# Patient Record
Sex: Female | Born: 1990 | Race: White | Hispanic: No | Marital: Single | Smoking: Never smoker
Health system: Southern US, Community
[De-identification: ages and names within clinical notes are randomized; demographics above are authoritative.]

## PROBLEM LIST (undated history)

## (undated) DIAGNOSIS — T7840XA Allergy, unspecified, initial encounter: Secondary | ICD-10-CM

## (undated) DIAGNOSIS — F419 Anxiety disorder, unspecified: Secondary | ICD-10-CM

## (undated) DIAGNOSIS — E785 Hyperlipidemia, unspecified: Secondary | ICD-10-CM

## (undated) DIAGNOSIS — E669 Obesity, unspecified: Secondary | ICD-10-CM

## (undated) DIAGNOSIS — R000 Tachycardia, unspecified: Secondary | ICD-10-CM

## (undated) HISTORY — DX: Hyperlipidemia, unspecified: E78.5

## (undated) HISTORY — PX: INCISION AND DRAINAGE ABSCESS: SHX5864

## (undated) HISTORY — DX: Allergy, unspecified, initial encounter: T78.40XA

## (undated) HISTORY — DX: Anxiety disorder, unspecified: F41.9

## (undated) HISTORY — DX: Tachycardia, unspecified: R00.0

## (undated) HISTORY — PX: TONSILLECTOMY: SUR1361

## (undated) HISTORY — DX: Obesity, unspecified: E66.9

---
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
## 2011-11-10 IMAGING — US ABDOMEN ULTRASOUND
1 series · 17 of 25 positions shown · non-contrast
Comparison: none

REASON FOR EXAM: abd pelvic pain     wwo transvaginal
COMMENTS:

[Series 1: abdomen ultrasound · 17 of 72 slices shown]
[im 1/72]
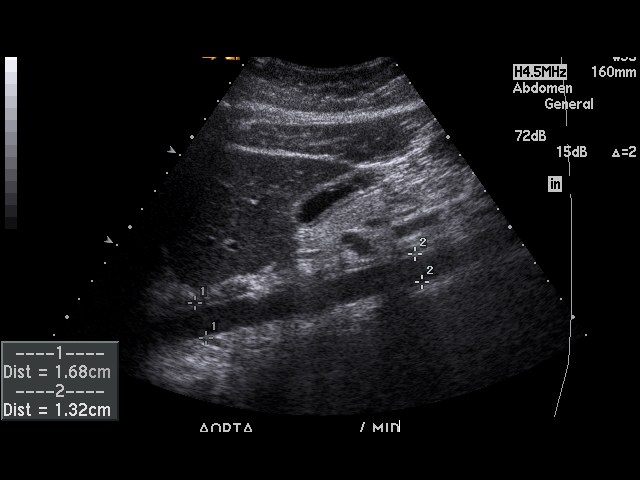
[im 6/72]
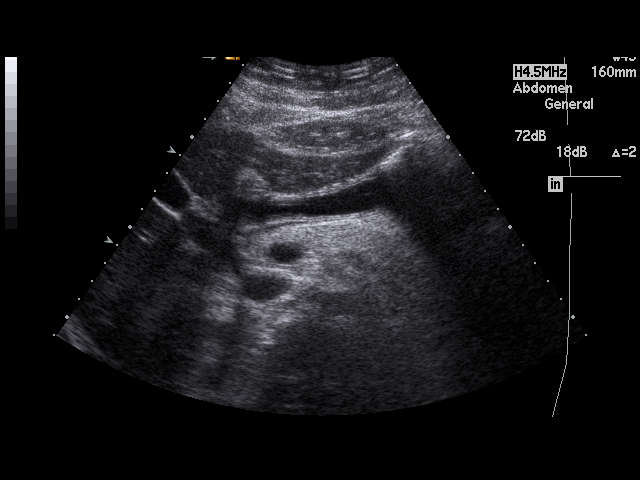
[im 9/72]
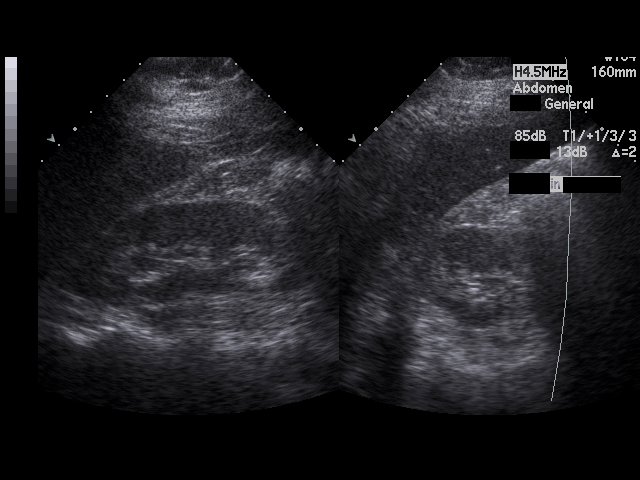
[im 15/72]
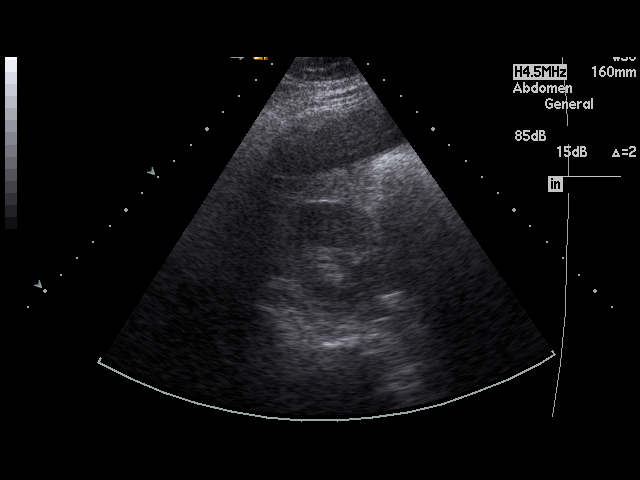
[im 18/72]
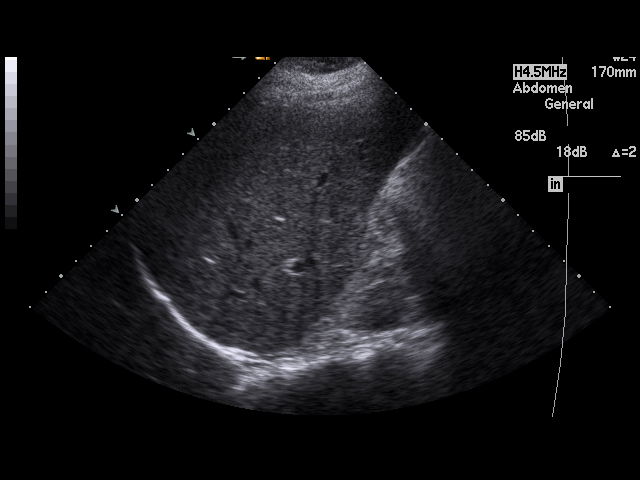
[im 24/72]
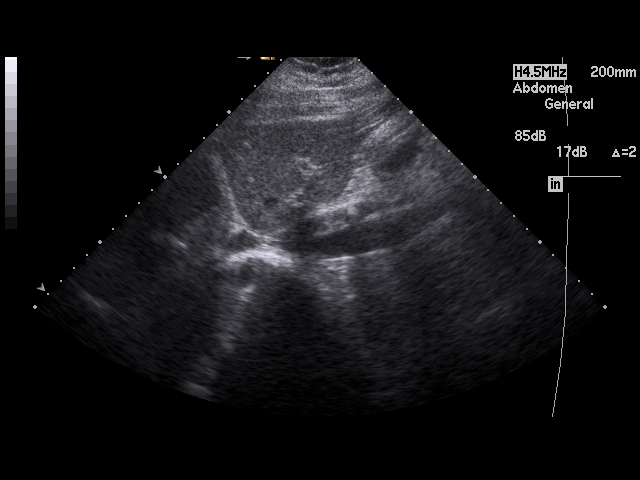
[im 27/72]
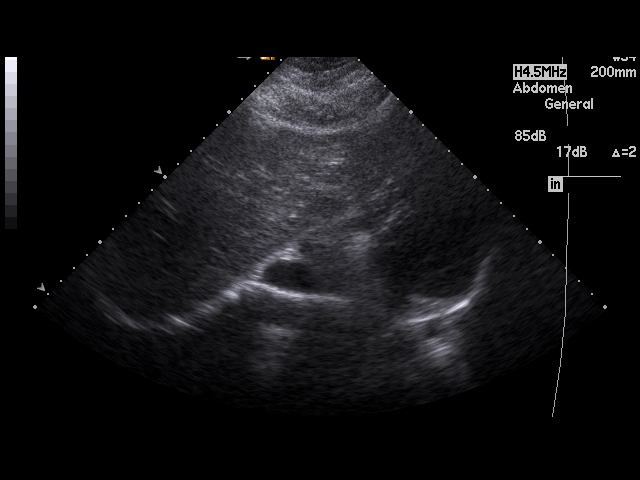
[im 33/72]
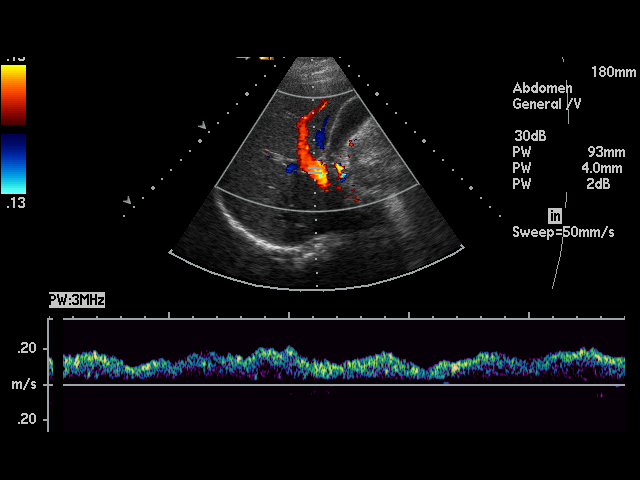
[im 36/72]
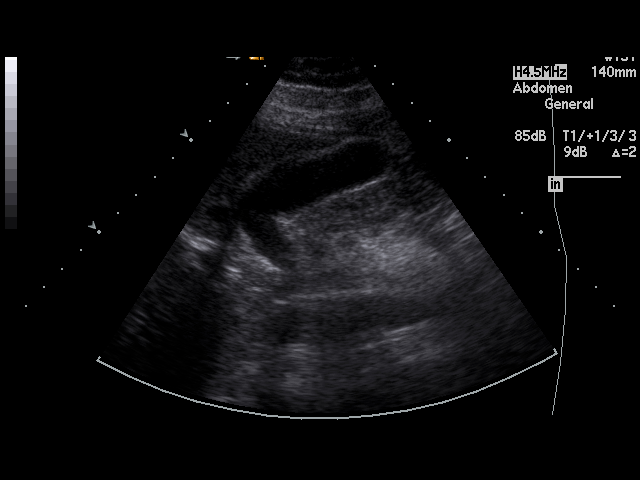
[im 39/72]
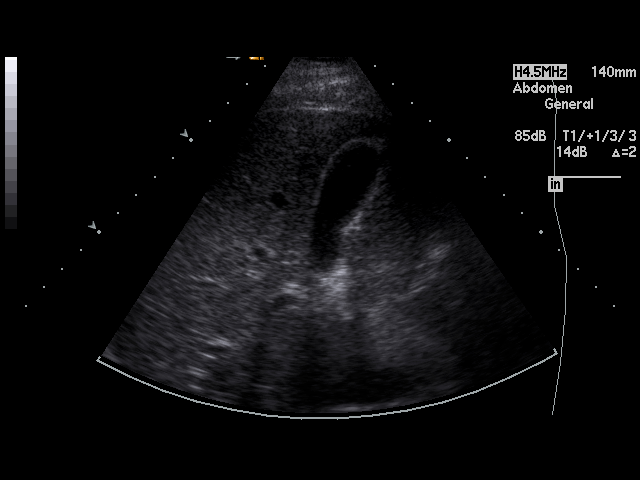
[im 45/72]
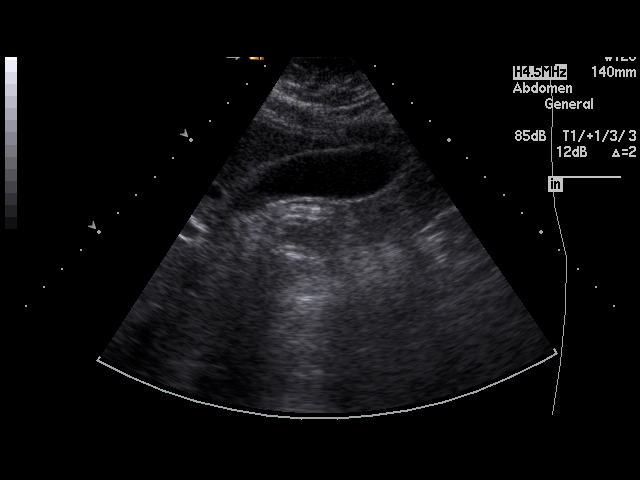
[im 48/72]
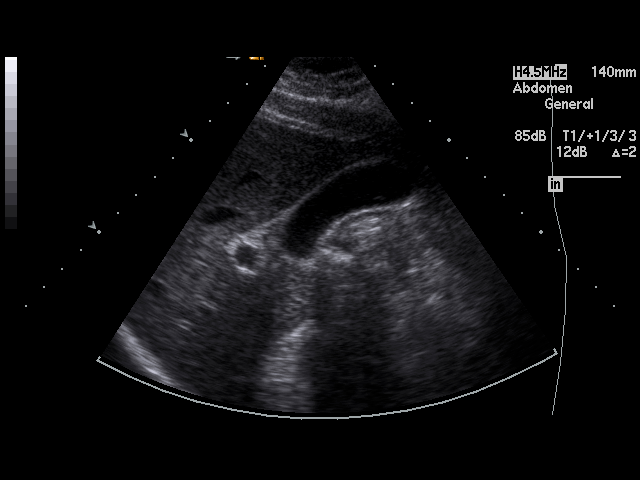
[im 54/72]
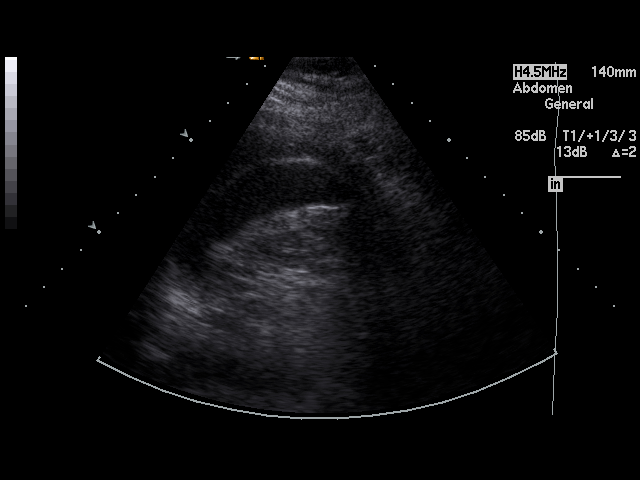
[im 57/72]
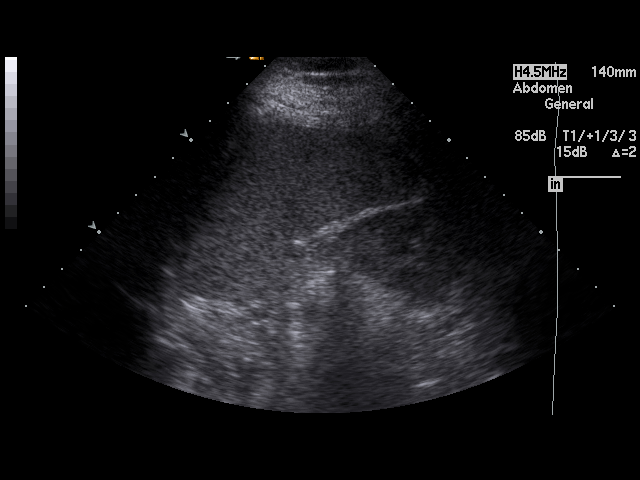
[im 63/72]
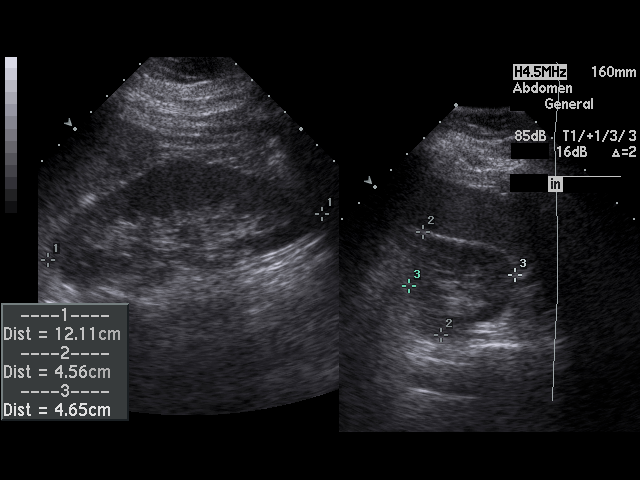
[im 66/72]
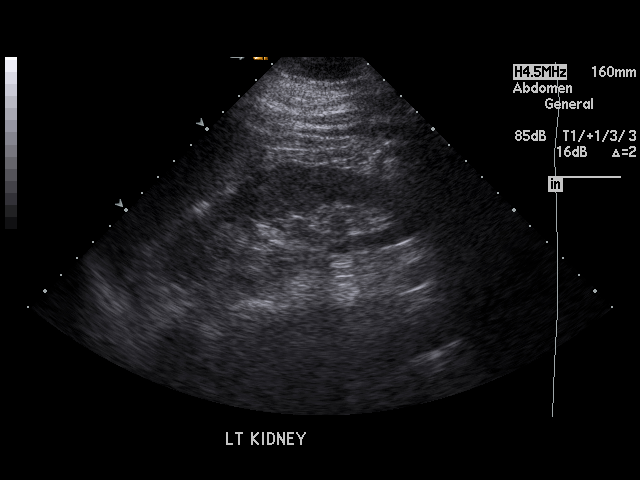
[im 72/72]
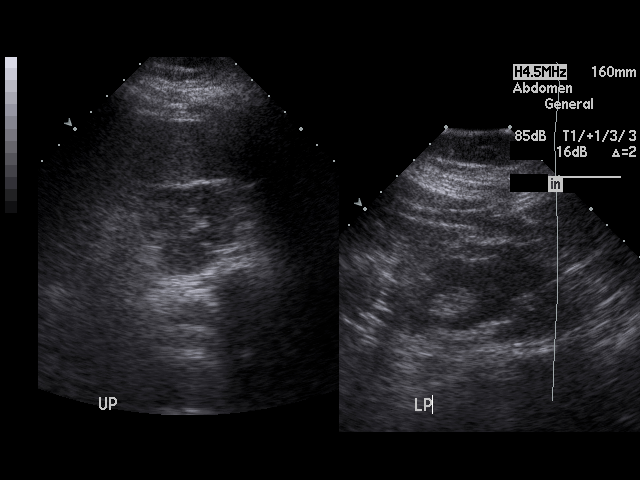

[17 of 25 positions shown; findings below may reference images not displayed]

PROCEDURE:     NASOMA - NASOMA ABDOMEN GENERAL SURVEY  - March 05, 2010 [DATE]

RESULT:     The liver, abdominal aorta and inferior vena cava show no
significant abnormalities. Spleen size is normal. There is a linear
hyperechoic band in the spleen which could be developmental or could be
secondary to residual change from prior spleen trauma. At any rate, this is
not an acute finding. The pancreas is not visualized adequately for
evaluation on this exam. No gallstones are seen. There is no thickening of
the gallbladder wall. The common bile duct measures 2.9 mm in diameter which
is within normal limits. The kidneys show no hydronephrosis. There is no
ascites.
IMPRESSION: 1. No gallstones or other acute change is identified.
2. The pancreas is not visualized adequately for evaluation on this exam.
3. There is a linear density traversing the spleen compatible with a
fibrotic band that could either be developmental or secondary to prior
trauma.

## 2013-08-20 ENCOUNTER — Ambulatory Visit: Payer: Self-pay | Admitting: Physician Assistant

## 2013-08-20 LAB — RAPID STREP-A WITH REFLX: MICRO TEXT REPORT: NEGATIVE

## 2013-08-20 LAB — RAPID INFLUENZA A&B ANTIGENS

## 2013-08-23 LAB — BETA STREP CULTURE(ARMC)

## 2013-11-04 ENCOUNTER — Ambulatory Visit: Payer: Self-pay | Admitting: Physician Assistant

## 2013-11-07 DIAGNOSIS — B029 Zoster without complications: Secondary | ICD-10-CM | POA: Insufficient documentation

## 2013-12-23 ENCOUNTER — Ambulatory Visit: Payer: Self-pay | Admitting: Physician Assistant

## 2013-12-23 LAB — CBC WITH DIFFERENTIAL/PLATELET
Basophil #: 0 10*3/uL (ref 0.0–0.1)
Basophil %: 0.4 %
EOS PCT: 2.8 %
Eosinophil #: 0.2 10*3/uL (ref 0.0–0.7)
HCT: 40.6 % (ref 35.0–47.0)
HGB: 13.3 g/dL (ref 12.0–16.0)
Lymphocyte #: 1.9 10*3/uL (ref 1.0–3.6)
Lymphocyte %: 21.7 %
MCH: 27.8 pg (ref 26.0–34.0)
MCHC: 32.9 g/dL (ref 32.0–36.0)
MCV: 85 fL (ref 80–100)
Monocyte #: 0.7 x10 3/mm (ref 0.2–0.9)
Monocyte %: 7.5 %
Neutrophil #: 6 10*3/uL (ref 1.4–6.5)
Neutrophil %: 67.6 %
Platelet: 236 10*3/uL (ref 150–440)
RBC: 4.8 10*6/uL (ref 3.80–5.20)
RDW: 13.1 % (ref 11.5–14.5)
WBC: 8.9 10*3/uL (ref 3.6–11.0)

## 2013-12-26 ENCOUNTER — Ambulatory Visit: Payer: Self-pay | Admitting: Physician Assistant

## 2013-12-29 ENCOUNTER — Emergency Department: Payer: Self-pay | Admitting: Emergency Medicine

## 2013-12-29 LAB — CBC WITH DIFFERENTIAL/PLATELET
BASOS ABS: 0 10*3/uL (ref 0.0–0.1)
BASOS PCT: 0.2 %
EOS ABS: 0 10*3/uL (ref 0.0–0.7)
Eosinophil %: 0.3 %
HCT: 38.1 % (ref 35.0–47.0)
HGB: 13 g/dL (ref 12.0–16.0)
Lymphocyte #: 1.6 10*3/uL (ref 1.0–3.6)
Lymphocyte %: 18.7 %
MCH: 28.3 pg (ref 26.0–34.0)
MCHC: 34.2 g/dL (ref 32.0–36.0)
MCV: 83 fL (ref 80–100)
MONO ABS: 0.8 x10 3/mm (ref 0.2–0.9)
Monocyte %: 9.1 %
Neutrophil #: 6.1 10*3/uL (ref 1.4–6.5)
Neutrophil %: 71.7 %
Platelet: 248 10*3/uL (ref 150–440)
RBC: 4.61 10*6/uL (ref 3.80–5.20)
RDW: 13.2 % (ref 11.5–14.5)
WBC: 8.6 10*3/uL (ref 3.6–11.0)

## 2013-12-29 LAB — COMPREHENSIVE METABOLIC PANEL
ALT: 25 U/L (ref 12–78)
AST: 17 U/L (ref 15–37)
Albumin: 3.7 g/dL (ref 3.4–5.0)
Alkaline Phosphatase: 91 U/L
Anion Gap: 6 — ABNORMAL LOW (ref 7–16)
BILIRUBIN TOTAL: 0.3 mg/dL (ref 0.2–1.0)
BUN: 10 mg/dL (ref 7–18)
CHLORIDE: 106 mmol/L (ref 98–107)
CO2: 26 mmol/L (ref 21–32)
CREATININE: 0.89 mg/dL (ref 0.60–1.30)
Calcium, Total: 9.5 mg/dL (ref 8.5–10.1)
EGFR (African American): >60
EGFR (Non-African Amer.): >60
Glucose: 84 mg/dL (ref 65–99)
Osmolality: 274 (ref 275–301)
POTASSIUM: 3.7 mmol/L (ref 3.5–5.1)
SODIUM: 138 mmol/L (ref 136–145)
TOTAL PROTEIN: 8.1 g/dL (ref 6.4–8.2)

## 2013-12-29 LAB — URINALYSIS, COMPLETE
BACTERIA: NONE SEEN
Bilirubin,UR: NEGATIVE
Glucose,UR: NEGATIVE mg/dL (ref 0–75)
Hyaline Cast: 2
KETONE: NEGATIVE
LEUKOCYTE ESTERASE: NEGATIVE
Nitrite: NEGATIVE
Ph: 5 (ref 4.5–8.0)
Protein: 100
RBC,UR: 341 /HPF (ref 0–5)
Specific Gravity: 1.029 (ref 1.003–1.030)
Squamous Epithelial: 5

## 2013-12-29 LAB — LIPASE, BLOOD: LIPASE: 75 U/L (ref 73–393)

## 2013-12-30 ENCOUNTER — Encounter (HOSPITAL_COMMUNITY): Payer: Self-pay | Admitting: Emergency Medicine

## 2013-12-30 ENCOUNTER — Emergency Department (HOSPITAL_COMMUNITY)
Admission: EM | Admit: 2013-12-30 | Discharge: 2013-12-30 | Disposition: A | Discharge status: Home / Self Care | Payer: Private Health Insurance - Indemnity | Attending: Emergency Medicine | Admitting: Emergency Medicine

## 2013-12-30 DIAGNOSIS — R000 Tachycardia, unspecified: Secondary | ICD-10-CM | POA: Insufficient documentation

## 2013-12-30 DIAGNOSIS — L03319 Cellulitis of trunk, unspecified: Principal | ICD-10-CM

## 2013-12-30 DIAGNOSIS — Z792 Long term (current) use of antibiotics: Secondary | ICD-10-CM | POA: Insufficient documentation

## 2013-12-30 DIAGNOSIS — Z79899 Other long term (current) drug therapy: Secondary | ICD-10-CM | POA: Insufficient documentation

## 2013-12-30 DIAGNOSIS — Z3202 Encounter for pregnancy test, result negative: Secondary | ICD-10-CM | POA: Insufficient documentation

## 2013-12-30 DIAGNOSIS — IMO0002 Reserved for concepts with insufficient information to code with codable children: Secondary | ICD-10-CM

## 2013-12-30 DIAGNOSIS — L02219 Cutaneous abscess of trunk, unspecified: Secondary | ICD-10-CM | POA: Insufficient documentation

## 2013-12-30 LAB — COMPREHENSIVE METABOLIC PANEL
ALT: 16 U/L (ref 0–35)
AST: 15 U/L (ref 0–37)
Albumin: 3.5 g/dL (ref 3.5–5.2)
Alkaline Phosphatase: 73 U/L (ref 39–117)
BUN: 7 mg/dL (ref 6–23)
CALCIUM: 9.4 mg/dL (ref 8.4–10.5)
CO2: 24 mEq/L (ref 19–32)
CREATININE: 0.75 mg/dL (ref 0.50–1.10)
Chloride: 104 mEq/L (ref 96–112)
GLUCOSE: 83 mg/dL (ref 70–99)
Potassium: 4 mEq/L (ref 3.7–5.3)
Sodium: 140 mEq/L (ref 137–147)
Total Bilirubin: 0.2 mg/dL — ABNORMAL LOW (ref 0.3–1.2)
Total Protein: 6.7 g/dL (ref 6.0–8.3)

## 2013-12-30 LAB — URINALYSIS, ROUTINE W REFLEX MICROSCOPIC
BILIRUBIN URINE: NEGATIVE
Glucose, UA: NEGATIVE mg/dL
Hgb urine dipstick: NEGATIVE
Ketones, ur: NEGATIVE mg/dL
LEUKOCYTES UA: NEGATIVE
NITRITE: NEGATIVE
PH: 6 (ref 5.0–8.0)
Protein, ur: NEGATIVE mg/dL
SPECIFIC GRAVITY, URINE: 1.008 (ref 1.005–1.030)
UROBILINOGEN UA: 0.2 mg/dL (ref 0.0–1.0)

## 2013-12-30 LAB — CBC WITH DIFFERENTIAL/PLATELET
BASOS ABS: 0 10*3/uL (ref 0.0–0.1)
Basophils Relative: 0 % (ref 0–1)
EOS PCT: 0 % (ref 0–5)
Eosinophils Absolute: 0 10*3/uL (ref 0.0–0.7)
HCT: 36.9 % (ref 36.0–46.0)
Hemoglobin: 12 g/dL (ref 12.0–15.0)
Lymphocytes Relative: 22 % (ref 12–46)
Lymphs Abs: 1.7 10*3/uL (ref 0.7–4.0)
MCH: 27 pg (ref 26.0–34.0)
MCHC: 32.5 g/dL (ref 30.0–36.0)
MCV: 82.9 fL (ref 78.0–100.0)
MONO ABS: 0.8 10*3/uL (ref 0.1–1.0)
Monocytes Relative: 10 % (ref 3–12)
Neutro Abs: 5.1 10*3/uL (ref 1.7–7.7)
Neutrophils Relative %: 68 % (ref 43–77)
PLATELETS: 243 10*3/uL (ref 150–400)
RBC: 4.45 MIL/uL (ref 3.87–5.11)
RDW: 12.5 % (ref 11.5–15.5)
WBC: 7.6 10*3/uL (ref 4.0–10.5)

## 2013-12-30 LAB — PREGNANCY, URINE: PREG TEST UR: NEGATIVE

## 2013-12-30 MED ORDER — OXYCODONE-ACETAMINOPHEN 5-325 MG PO TABS
1.0000 | ORAL_TABLET | Freq: Once | ORAL | Status: AC
  Administered 2013-12-30: 1 via ORAL
  Filled 2013-12-30: qty 1

## 2013-12-30 MED ORDER — HYDROCODONE-ACETAMINOPHEN 5-325 MG PO TABS
1.0000 | ORAL_TABLET | ORAL | Status: DC | PRN
Start: 2013-12-30 — End: 2015-03-06

## 2013-12-30 NOTE — ED Notes (Signed)
MD at bedside. Rubye OaksPalmer PA

## 2013-12-30 NOTE — ED Provider Notes (Signed)
CSN: [id number]     Arrival date & time 12/30/13  6295 History   First MD Initiated Contact with Patient 12/30/13 [phone]     Chief Complaint  Patient presents with  . Abdominal Pain   HPI  Veronica Hurst is a 23 y.o. female with a PMH of abscesses who presents to the ED for evaluation of abdominal pain. History was provided by the patient. Patient states she has had an abscess to her periumbilical region for the past 1.5 weeks, which has been getting progressively larger and more painful. She has a blister on top of the area but denies any drainage. No previous wounds or injuries. Has had abscesses in the past in her axilla but never in her abdomen. Patient has been taking Ibuprofen and Tylenol PM for pain. She denies any diarrhea, constipation, dysuria, vaginal bleeding/discharge, fever, chills, change in appetite/activity. She went to UC twice for this and was started on Bactrim one week ago which she has been taking. She takes doxycycline chronically and has been on this since January.      History reviewed. No pertinent past medical history. Past Surgical History  Procedure Laterality Date  . Tonsillectomy     No family history on file. History  Substance Use Topics  . Smoking status: Never Smoker   . Smokeless tobacco: Not on file  . Alcohol Use: Yes     Comment: occassional   OB History   Grav Para Term Preterm Abortions TAB SAB Ect Mult Living                  Review of Systems  Constitutional: Negative for fever, chills, activity change, appetite change and fatigue.  Respiratory: Negative for cough and shortness of breath.   Cardiovascular: Negative for chest pain and leg swelling.  Gastrointestinal: Positive for abdominal pain. Negative for nausea, vomiting, diarrhea, constipation and blood in stool.  Genitourinary: Negative for dysuria, decreased urine volume, vaginal bleeding, vaginal discharge, difficulty urinating, vaginal pain and pelvic pain.  Musculoskeletal: Negative  for myalgias and neck pain.  Skin: Positive for color change and wound.  Neurological: Negative for dizziness, weakness, light-headedness and headaches.    Allergies  Review of patient's allergies indicates no known allergies.  Home Medications   Prior to Admission medications   Medication Sig Start Date End Date Taking? Authorizing Provider  amphetamine-dextroamphetamine (ADDERALL) 20 MG tablet Take 40 mg by mouth daily.   Yes Historical Provider, MD  Biotin 10 MG CAPS Take 1 capsule by mouth daily.   Yes Historical Provider, MD  doxycycline (VIBRA-TABS) 100 MG tablet Take 100 mg by mouth 2 (two) times daily.   Yes Historical Provider, MD  magnesium gluconate (MAGONATE) 500 MG tablet Take 500 mg by mouth daily.   Yes Historical Provider, MD  sulfamethoxazole-trimethoprim (BACTRIM DS) 800-160 MG per tablet Take 1 tablet by mouth 2 (two) times daily.   Yes Historical Provider, MD   BP 135/91  Pulse 106  Temp(Src) 98 F (36.7 C) (Oral)  Resp 16  Ht 5\' 4"  (1.626 m)  Wt 204 lb (92.534 kg)  BMI 35.00 kg/m2  SpO2 100%  LMP 12/19/2013  Filed Vitals:   12/30/13 1100 12/30/13 1115 12/30/13 1129 12/30/13 1130  BP: 99/64 103/66 106/68 106/68  Pulse: 102 102 97 96  Temp:      TempSrc:      Resp:      Height:      Weight:      SpO2: 100% 100% 100%  100%    Physical Exam  Nursing note and vitals reviewed. Constitutional: She is oriented to person, place, and time. She appears well-developed and well-nourished. No distress.  HENT:  Head: Normocephalic and atraumatic.  Right Ear: External ear normal.  Left Ear: External ear normal.  Nose: Nose normal.  Mouth/Throat: Oropharynx is clear and moist.  Eyes: Conjunctivae are normal. Right eye exhibits no discharge. Left eye exhibits no discharge.  Neck: Normal range of motion. Neck supple.  Cardiovascular: Normal rate, regular rhythm and normal heart sounds.  Exam reveals no gallop and no friction rub.   No murmur  heard. Pulmonary/Chest: Effort normal and breath sounds normal. No respiratory distress. She has no wheezes. She has no rales. She exhibits no tenderness.  Abdominal: Soft. Bowel sounds are normal. She exhibits no distension and no mass. There is tenderness. There is no rebound and no guarding.    Periumbilical superficial 3 cm x 3 cm area of induration with an overlying area of fluctuance. No open wounds or drainage. Focal tenderness to the area. No tenderness to palpation to the rest of the abdomen throughout.  Musculoskeletal: Normal range of motion. She exhibits no edema and no tenderness.  Neurological: She is alert and oriented to person, place, and time.  Skin: Skin is warm and dry. She is not diaphoretic.     ED Course  INCISION AND DRAINAGE Date/Time: 12/30/2013 11:51 AM Performed by: Coral CeoPALMER, Mry Lamia K Authorized by: Jillyn LedgerPALMER, Rayner Erman K Consent: Verbal consent obtained. Consent given by: patient Patient identity confirmed: verbally with patient Type: abscess Body area: trunk Location details: abdomen Anesthesia: local infiltration Local anesthetic: lidocaine 2% without epinephrine Anesthetic total: 6 ml Patient sedated: no Scalpel size: 11 Incision type: single straight Complexity: simple Drainage: purulent and  bloody Drainage amount: scant Wound treatment: wound left open Packing material: none Patient tolerance: Patient tolerated the procedure well with no immediate complications. Comments: Patient unable to tolerate drain    (including critical care time) Labs Review Labs Reviewed - No data to display  Imaging Review No results found.   EKG Interpretation None      Results for orders placed during the hospital encounter of 12/30/13  CBC WITH DIFFERENTIAL      Result Value Ref Range   WBC 7.6  4.0 - 10.5 K/uL   RBC 4.45  3.87 - 5.11 MIL/uL   Hemoglobin 12.0  12.0 - 15.0 g/dL   HCT 16.136.9  09.636.0 - 04.546.0 %   MCV 82.9  78.0 - 100.0 fL   MCH 27.0  26.0 - 34.0  pg   MCHC 32.5  30.0 - 36.0 g/dL   RDW 40.912.5  81.111.5 - 91.415.5 %   Platelets 243  150 - 400 K/uL   Neutrophils Relative % 68  43 - 77 %   Neutro Abs 5.1  1.7 - 7.7 K/uL   Lymphocytes Relative 22  12 - 46 %   Lymphs Abs 1.7  0.7 - 4.0 K/uL   Monocytes Relative 10  3 - 12 %   Monocytes Absolute 0.8  0.1 - 1.0 K/uL   Eosinophils Relative 0  0 - 5 %   Eosinophils Absolute 0.0  0.0 - 0.7 K/uL   Basophils Relative 0  0 - 1 %   Basophils Absolute 0.0  0.0 - 0.1 K/uL  COMPREHENSIVE METABOLIC PANEL      Result Value Ref Range   Sodium 140  137 - 147 mEq/L   Potassium 4.0  3.7 - 5.3  mEq/L   Chloride 104  96 - 112 mEq/L   CO2 24  19 - 32 mEq/L   Glucose, Bld 83  70 - 99 mg/dL   BUN 7  6 - 23 mg/dL   Creatinine, Ser 0.980.75  0.50 - 1.10 mg/dL   Calcium 9.4  8.4 - 11.910.5 mg/dL   Total Protein 6.7  6.0 - 8.3 g/dL   Albumin 3.5  3.5 - 5.2 g/dL   AST 15  0 - 37 U/L   ALT 16  0 - 35 U/L   Alkaline Phosphatase 73  39 - 117 U/L   Total Bilirubin 0.2 (*) 0.3 - 1.2 mg/dL   GFR calc non Af Amer >90  >90 mL/min   GFR calc Af Amer >90  >90 mL/min  URINALYSIS, ROUTINE W REFLEX MICROSCOPIC      Result Value Ref Range   Color, Urine YELLOW  YELLOW   APPearance CLEAR  CLEAR   Specific Gravity, Urine 1.008  1.005 - 1.030   pH 6.0  5.0 - 8.0   Glucose, UA NEGATIVE  NEGATIVE mg/dL   Hgb urine dipstick NEGATIVE  NEGATIVE   Bilirubin Urine NEGATIVE  NEGATIVE   Ketones, ur NEGATIVE  NEGATIVE mg/dL   Protein, ur NEGATIVE  NEGATIVE mg/dL   Urobilinogen, UA 0.2  0.0 - 1.0 mg/dL   Nitrite NEGATIVE  NEGATIVE   Leukocytes, UA NEGATIVE  NEGATIVE  PREGNANCY, URINE      Result Value Ref Range   Preg Test, Ur NEGATIVE  NEGATIVE     MDM   Veronica Hurst is a 23 y.o. female with a PMH of abscesses who presents to the ED for evaluation of abdominal pain, which is likely due to an abdominal abscess. Abscess drained in the ED. Doubt intraabdominal process. Abdominal tenderness is localized to the area. Patient afebrile and  non-toxic in appearance. No leukocytosis. Labs unremarkable. Patient currently on doxycycline and bactrim. Instructed patient to continue antibiotics and follow-up with PCP on Monday for wound re-check. Patient had mild tachycardia which I suspect is due to pain and anxiety. No chest pain or SOB. Vitals otherwise stable. Return precautions, discharge instructions, and follow-up was discussed with the patient before discharge.      Discharge Medication List as of 12/30/2013 11:56 AM    START taking these medications   Details  HYDROcodone-acetaminophen (NORCO/VICODIN) 5-325 MG per tablet Take 1-2 tablets by mouth every 4 (four) hours as needed., Starting 12/30/2013, Until Discontinued, Print         Final impressions: 1. Abscess, abdomen      Luiz IronJessica Katlin Jamoni Broadfoot PA-C   This patient was discussed with Dr. Barbarann EhlersJacubowitz        Exodus Kutzer K Tylor Courtwright, PA-C 12/30/13 2222

## 2013-12-30 NOTE — Discharge Instructions (Signed)
Warm compresses several times a day  Keep area clean and dry  Antibiotic ointment  Vicodin for severe pain - Please be careful with this medication.  It can cause drowsiness.  Use caution while driving, operating machinery, drinking alcohol, or any other activities that may impair your physical or mental abilities.   Ibuprofen for mild-moderate pain  Continue to take Doxycycline and bactrim  Return to the emergency department if you develop any changing/worsening condition, repeated vomiting, severe abdominal pain, fever, increased drainage, spreading redness/swelling, or any other concerns (please read additional information regarding your condition below)   Abscess An abscess is an infected area that contains a collection of pus and debris.It can occur in almost any part of the body. An abscess is also known as a furuncle or boil. CAUSES  An abscess occurs when tissue gets infected. This can occur from blockage of oil or sweat glands, infection of hair follicles, or a minor injury to the skin. As the body tries to fight the infection, pus collects in the area and creates pressure under the skin. This pressure causes pain. People with weakened immune systems have difficulty fighting infections and get certain abscesses more often.  SYMPTOMS Usually an abscess develops on the skin and becomes a painful mass that is red, warm, and tender. If the abscess forms under the skin, you may feel a moveable soft area under the skin. Some abscesses break open (rupture) on their own, but most will continue to get worse without care. The infection can spread deeper into the body and eventually into the bloodstream, causing you to feel ill.  DIAGNOSIS  Your caregiver will take your medical history and perform a physical exam. A sample of fluid may also be taken from the abscess to determine what is causing your infection. TREATMENT  Your caregiver may prescribe antibiotic medicines to fight the infection.  However, taking antibiotics alone usually does not cure an abscess. Your caregiver may need to make a small cut (incision) in the abscess to drain the pus. In some cases, gauze is packed into the abscess to reduce pain and to continue draining the area. HOME CARE INSTRUCTIONS   Only take over-the-counter or prescription medicines for pain, discomfort, or fever as directed by your caregiver.  If you were prescribed antibiotics, take them as directed. Finish them even if you start to feel better.  If gauze is used, follow your caregiver's directions for changing the gauze.  To avoid spreading the infection:  Keep your draining abscess covered with a bandage.  Wash your hands well.  Do not share personal care items, towels, or whirlpools with others.  Avoid skin contact with others.  Keep your skin and clothes clean around the abscess.  Keep all follow-up appointments as directed by your caregiver. SEEK MEDICAL CARE IF:   You have increased pain, swelling, redness, fluid drainage, or bleeding.  You have muscle aches, chills, or a general ill feeling.  You have a fever. MAKE SURE YOU:   Understand these instructions.  Will watch your condition.  Will get help right away if you are not doing well or get worse. Document Released: 05/14/2005 Document Revised: 02/03/2012 Document Reviewed: 10/17/2011 [facility] Patient Information 2014 [employer] [place].

## 2013-12-30 NOTE — ED Notes (Signed)
Pt. Developed an area that is red, blistered and noticeable pus upper inner belly button.  Pt. Also has pain on palpation above the umbilicus.  Pt. Very guarded and site is very tender.   Denies any n/v/d denies any fevers.

## 2013-12-30 NOTE — ED Provider Notes (Signed)
Patient with 3 cm superficial abscess at umbilicus. Draining yellow pus. She has mild tenderness and redness immediately overlying the area. No surrounding tenderness. Is nontoxic and well-appearing. Appetite normal. She did not feel ill   Doug SouSam Shanavia Makela, MD 12/30/13 1103

## 2014-01-02 NOTE — ED Provider Notes (Signed)
Medical screening examination/treatment/procedure(s) were conducted as a shared visit with non-physician practitioner(s) and myself.  I personally evaluated the patient during the encounter.   EKG Interpretation None       Doug SouSam Channelle Bottger, MD 01/02/14 [phone]

## 2014-11-29 ENCOUNTER — Encounter: Payer: Self-pay | Admitting: *Deleted

## 2015-03-06 ENCOUNTER — Encounter: Payer: Self-pay | Admitting: Obstetrics and Gynecology

## 2015-03-06 ENCOUNTER — Encounter: Payer: Self-pay | Admitting: Family Medicine

## 2015-03-06 ENCOUNTER — Ambulatory Visit (INDEPENDENT_AMBULATORY_CARE_PROVIDER_SITE_OTHER): Payer: 59 | Admitting: Family Medicine

## 2015-03-06 VITALS — BP 118/72 | HR 100 | Temp 97.9°F | Resp 16 | Ht 63.0 in | Wt 211.6 lb

## 2015-03-06 DIAGNOSIS — R5383 Other fatigue: Secondary | ICD-10-CM | POA: Diagnosis not present

## 2015-03-06 DIAGNOSIS — F419 Anxiety disorder, unspecified: Secondary | ICD-10-CM | POA: Insufficient documentation

## 2015-03-06 DIAGNOSIS — F900 Attention-deficit hyperactivity disorder, predominantly inattentive type: Secondary | ICD-10-CM

## 2015-03-06 DIAGNOSIS — Z131 Encounter for screening for diabetes mellitus: Secondary | ICD-10-CM | POA: Diagnosis not present

## 2015-03-06 DIAGNOSIS — E785 Hyperlipidemia, unspecified: Secondary | ICD-10-CM | POA: Diagnosis not present

## 2015-03-06 DIAGNOSIS — F411 Generalized anxiety disorder: Secondary | ICD-10-CM | POA: Diagnosis not present

## 2015-03-06 DIAGNOSIS — E669 Obesity, unspecified: Secondary | ICD-10-CM

## 2015-03-06 DIAGNOSIS — Z6841 Body Mass Index (BMI) 40.0 and over, adult: Secondary | ICD-10-CM

## 2015-03-06 HISTORY — DX: Hyperlipidemia, unspecified: E78.5

## 2015-03-06 MED ORDER — AMPHETAMINE-DEXTROAMPHET ER 15 MG PO CP24: 15.0000 mg | ORAL_CAPSULE | ORAL | Status: DC

## 2015-03-06 NOTE — Progress Notes (Signed)
Name: Veronica Hurst   MRN: [id number]    DOB: 01/06/1991   Date:03/06/2015       Progress Note  Subjective  Chief Complaint  Chief Complaint  Patient presents with  . Establish Care  . Anxiety    Patient has history of anxiety but has had increased episodes recently.  . ADHD    Wants to re-start her medication.    HPI  Anxiety: Patient complains of anxiety disorder.  She has the following symptoms: difficulty concentrating, fatigue, feelings of losing control, irritable, psychomotor agitation, racing thoughts. Onset of symptoms was approximately several years ago, stable since that time. She denies current suicidal and homicidal ideation. Family history significant for anxiety and depression.Possible organic causes contributing are: endocrine/metabolic. Risk factors: none Previous treatment includes ADHD medication and none.  She complains of the following side effects from the treatment: none.  Attention Deficit Disorder: The condition is characterized as fidgeting, loses items necessary for activity, interrupting others, poor attention span, shifting from one uncompleted task to another and easily distracted but not excessive talking, difficulty remaining seated, difficulty waiting for her turn, engages in physically dangerous activities, difficulty waiting for her turn, blurting out answers before question is complete, difficulty following instructions or antisocial behavior. There has been no associated hearing difficulties, vision disturbances other than anxiety. She currently has been off of her Adderall 20mg  po bid for about 1 year now as she [place] a [place]. She notes no side effects from her previous medication use and is interested and re-starting.   Patient Active Problem List   Diagnosis Date Noted  . Obesity, Class II, BMI 35-39.9, isolated 03/06/2015  . Attention deficit hyperactivity disorder (ADHD), inattentive type,  moderate 03/06/2015  . Anxiety disorder 03/06/2015  . Other fatigue 03/06/2015  . Herpes zona 11/07/2013  . Acne inversa 10/11/2012    History  Substance Use Topics  . Smoking status: Never Smoker   . Smokeless tobacco: Not on file  . Alcohol Use: 0.0 oz/week    0 Standard drinks or equivalent per week     Comment: occassional     Current outpatient prescriptions:  .  clindamycin (CLINDAGEL) 1 % gel, Apply topically., Disp: , Rfl:  .  doxycycline (VIBRA-TABS) 100 MG tablet, Take 100 mg by mouth 2 (two) times daily., Disp: , Rfl:  .  amphetamine-dextroamphetamine (ADDERALL XR) 15 MG 24 hr capsule, Take 1 capsule by mouth every morning., Disp: 30 capsule, Rfl: 0  Past Surgical History  Procedure Laterality Date  . Tonsillectomy    . Incision and drainage abscess N/A     abdomen    Family History  Problem Relation Age of Onset  . Hyperlipidemia Mother   . Hypertension Father   . Heart disease Father     on dad's side  . Cancer Maternal Grandfather     No Known Allergies   Review of Systems  CONSTITUTIONAL: No significant weight changes but worries about her overweight status, fever, chills, weakness. Yes fatigue.  HEENT:  - Eyes: No visual changes.  - Ears: No auditory changes. No pain.  - Nose: No sneezing, congestion, runny nose. - Throat: No sore throat. No changes in swallowing. SKIN: No rash or itching.  CARDIOVASCULAR: No chest pain, chest pressure or chest discomfort. No palpitations or edema.  RESPIRATORY: No shortness of breath, cough or sputum.  GASTROINTESTINAL: No anorexia, nausea, vomiting. No changes in bowel habits. No abdominal pain or  blood.  GENITOURINARY: No dysuria. No frequency. No discharge. NEUROLOGICAL: No headache, dizziness, syncope, paralysis, ataxia, numbness or tingling in the extremities. No memory changes. No change in bowel or bladder control.  MUSCULOSKELETAL: No joint pain. No muscle pain. HEMATOLOGIC: No anemia, bleeding or  bruising.  LYMPHATICS: No enlarged lymph nodes.  PSYCHIATRIC: Yes anxiety and focus issues. No change in sleep pattern.  ENDOCRINOLOGIC: No reports of sweating, cold or heat intolerance. No polyuria or polydipsia.      Objective  BP 118/72 mmHg  Pulse 100  Temp(Src) 97.9 F (36.6 C) (Oral)  Resp 16  Ht 5\' 3"  (1.6 m)  Wt 211 lb 9.6 oz (95.981 kg)  BMI 37.49 kg/m2  SpO2 97%  LMP 02/07/2015 (Approximate) Body mass index is 37.49 kg/(m^2).  Physical Exam  Constitutional: Patient appears overweight and well-nourished. In no distress.  HEENT:  - Head: Normocephalic and atraumatic.  - Ears: Bilateral TMs gray, no erythema or effusion - Nose: Nasal mucosa moist - Mouth/Throat: Oropharynx is clear and moist. No tonsillar hypertrophy or erythema. No post nasal drainage.  - Eyes: Conjunctivae clear, EOM movements normal. PERRLA. No scleral icterus.  Neck: Normal range of motion. Neck supple. No JVD present. No thyromegaly present.  Cardiovascular: Normal rate, regular rhythm and normal heart sounds.  No murmur heard.  Pulmonary/Chest: Effort normal and breath sounds normal. No respiratory distress. Musculoskeletal: Normal range of motion bilateral UE and LE, no joint effusions. Peripheral vascular: Bilateral LE no edema. Neurological: CN II-XII grossly intact with no focal deficits. Alert and oriented to person, place, and time. Coordination, balance, strength, speech and gait are normal.  Skin: Skin is warm and dry. No rash noted. No erythema.  Psychiatric: Patient has a normal mood and affect. Behavior is normal in office today. Judgment and thought content normal in office today.   Assessment & Plan  1. Obesity, Class II, BMI 35-39.9, isolated The patient has been counseled on their higher than normal BMI.  They have verbally expressed understanding their increased risk for other diseases.  In efforts to meet a better target BMI goal the patient has been counseled on lifestyle,  diet and exercise modification tactics. Start with moderate intensity aerobic exercise (walking, jogging, elliptical, swimming, group or individual sports, hiking) at least a day at least 4 days a week and increase intensity, duration, frequency as tolerated. Diet should include well balance fresh fruits and vegetables avoiding processed foods, carbohydrates and sugars. Drink at least 8oz 10 glasses a day avoiding sodas, sugary fruit drinks, sweetened tea. Check weight on a reliable scale daily and monitor weight loss progress daily. Consider investing in mobile phone apps that will help keep track of weight loss goals.  - CBC with Differential/Platelet - Comprehensive metabolic panel - Lipid panel - TSH - Hemoglobin A1c  2. Attention deficit hyperactivity disorder (ADHD), inattentive type, moderate Release of records signed to obtain official evaluation documenting ADHD. NCCSRS checked, no irregular activity identified. Restart Adderall at 15MG  with XR formulation. Patient is to call for further refills with dose and frequency change if needed. Follow up in office every 3 months or sooner if there are changes.   The patient has been prescribed a controled substance today under the agreement of a filed pain treatment regimen contract.  With use of this medication they verbalize understanding the potential risk of addiction, abuse, and misuse, which can lead to overdose and death. The patient may not obtain and use other illicit or controled substances from any  other sources while under the aformentioned contract. A urine drug screen will be performed periodically and the patient's name will be reviewed on the  Controlled Substance Reporting System regularly.  The patient expresses understanding the above statement and agreement to comply.  The patient has been counseled on the proper use, side effects and potential interactions of the new medication with other prescribed and OTC medications.  Under no circumstances is this (and any other) medication to be use with alcohol or other illicit drugs. This medication is not to be crushed, chewed, sniffed, injected or used in any other way other than what is stated in the directions. This medication is to be used at the frequency and quantity that is stated in the directions. This medication is to be used only by the individual who's name is on the prescription bottle. Drug sharing and selling is unacceptable. Patient voices understanding what has been said today.  - amphetamine-dextroamphetamine (ADDERALL XR) 15 MG 24 hr capsule; Take 1 capsule by mouth every morning.  Dispense: 30 capsule; Refill: 0  3. Generalized anxiety disorder Situational mostly with car rides. Likely related to ADHD which is not well controlled. Monitor and if symptoms persist will initiate medication therapy.  4. Other fatigue Lab work to rule out pathology.  - CBC with Differential/Platelet - Comprehensive metabolic panel - TSH - Hemoglobin A1c  5. Hyperlipidemia LDL goal <100 Lab work.  - Lipid panel - Hemoglobin A1c  6. Screening for diabetes mellitus (DM) Increased risk due to body habitus and family history.  - Hemoglobin A1c

## 2015-03-22 ENCOUNTER — Encounter: Payer: Self-pay | Admitting: Family Medicine

## 2015-03-22 LAB — CBC WITH DIFFERENTIAL/PLATELET
BASOS ABS: 0 10*3/uL (ref 0.0–0.2)
Basos: 1 %
EOS (ABSOLUTE): 0.1 10*3/uL (ref 0.0–0.4)
EOS: 1 %
Hematocrit: 41.6 % (ref 34.0–46.6)
Hemoglobin: 13.4 g/dL (ref 11.1–15.9)
IMMATURE GRANULOCYTES: 0 %
Immature Grans (Abs): 0 10*3/uL (ref 0.0–0.1)
LYMPHS: 33 %
Lymphocytes Absolute: 2 10*3/uL (ref 0.7–3.1)
MCH: 27.6 pg (ref 26.6–33.0)
MCHC: 32.2 g/dL (ref 31.5–35.7)
MCV: 86 fL (ref 79–97)
Monocytes Absolute: 0.5 10*3/uL (ref 0.1–0.9)
Monocytes: 8 %
NEUTROS PCT: 57 %
Neutrophils Absolute: 3.5 10*3/uL (ref 1.4–7.0)
Platelets: 249 10*3/uL (ref 150–379)
RBC: 4.85 x10E6/uL (ref 3.77–5.28)
RDW: 13 % (ref 12.3–15.4)
WBC: 6.1 10*3/uL (ref 3.4–10.8)

## 2015-03-22 LAB — COMPREHENSIVE METABOLIC PANEL
ALK PHOS: 78 IU/L (ref 39–117)
ALT: 16 IU/L (ref 0–32)
AST: 15 IU/L (ref 0–40)
Albumin/Globulin Ratio: 1.6 (ref 1.1–2.5)
Albumin: 4.3 g/dL (ref 3.5–5.5)
BUN / CREAT RATIO: 10 (ref 8–20)
BUN: 7 mg/dL (ref 6–20)
Bilirubin Total: 0.4 mg/dL (ref 0.0–1.2)
CHLORIDE: 99 mmol/L (ref 97–108)
CO2: 27 mmol/L (ref 18–29)
CREATININE: 0.7 mg/dL (ref 0.57–1.00)
Calcium: 9.7 mg/dL (ref 8.7–10.2)
GFR, EST AFRICAN AMERICAN: 142 mL/min/{1.73_m2} (ref >59)
GFR, EST NON AFRICAN AMERICAN: 123 mL/min/{1.73_m2} (ref >59)
Globulin, Total: 2.7 g/dL (ref 1.5–4.5)
Glucose: 67 mg/dL (ref 65–99)
POTASSIUM: 4.3 mmol/L (ref 3.5–5.2)
Sodium: 141 mmol/L (ref 134–144)
Total Protein: 7 g/dL (ref 6.0–8.5)

## 2015-03-22 LAB — LIPID PANEL
Chol/HDL Ratio: 2.7 ratio units (ref 0.0–4.4)
Cholesterol, Total: 179 mg/dL (ref 100–199)
HDL: 67 mg/dL (ref >39)
LDL Calculated: 92 mg/dL (ref 0–99)
Triglycerides: 98 mg/dL (ref 0–149)
VLDL CHOLESTEROL CAL: 20 mg/dL (ref 5–40)

## 2015-03-22 LAB — HEMOGLOBIN A1C
Est. average glucose Bld gHb Est-mCnc: 100 mg/dL
Hgb A1c MFr Bld: 5.1 % (ref 4.8–5.6)

## 2015-03-22 LAB — TSH: TSH: 2.94 u[IU]/mL (ref 0.450–4.500)

## 2015-04-02 ENCOUNTER — Encounter: Payer: Self-pay | Admitting: Family Medicine

## 2015-04-03 ENCOUNTER — Encounter: Payer: Self-pay | Admitting: *Deleted

## 2015-04-03 ENCOUNTER — Telehealth: Payer: Self-pay | Admitting: Family Medicine

## 2015-04-03 ENCOUNTER — Other Ambulatory Visit: Payer: Self-pay | Admitting: *Deleted

## 2015-04-03 NOTE — Telephone Encounter (Signed)
Pt would like a call back to discuss medications.  

## 2015-04-03 NOTE — Telephone Encounter (Signed)
Contacted this patient to see how I can be of assistance to her, but she stated that she had sent a message to Dr. Sherley Bounds on MyChart, so I told her that I will inform Dr. Sherley Bounds to check her email and then we will take it from there.  Also this patient stated that a release of medical information should be faxed to [phone] Attn: Dr. Dolores Frame, so that we could get her most recent notes.

## 2015-04-04 ENCOUNTER — Other Ambulatory Visit: Payer: Self-pay | Admitting: Family Medicine

## 2015-04-04 ENCOUNTER — Telehealth: Payer: Self-pay | Admitting: Family Medicine

## 2015-04-04 DIAGNOSIS — F900 Attention-deficit hyperactivity disorder, predominantly inattentive type: Secondary | ICD-10-CM

## 2015-04-04 MED ORDER — AMPHETAMINE-DEXTROAMPHET ER 20 MG PO CP24: 20.0000 mg | ORAL_CAPSULE | ORAL | Status: DC

## 2015-04-04 NOTE — Telephone Encounter (Signed)
Returned patient's call to inform her that her rx is ready for pick up.

## 2015-04-04 NOTE — Telephone Encounter (Signed)
A message was left for this patient stating her rx is ready for pick up.

## 2015-04-04 NOTE — Telephone Encounter (Signed)
On 03/22/15 and 04/02/15 there are Email Messages under the Encounters tab in her chart but no information to review. I do not remember having an Email conversation with her regarding any questions she may have had. Please review with Miel and let patient know.

## 2015-04-04 NOTE — Telephone Encounter (Signed)
PT SAID THAT SHE IS RETURNING YOUR CALL. PLEASE CALL [phone]

## 2015-04-04 NOTE — Telephone Encounter (Signed)
Please let Veronica Hurst know that her MyChart message to me got rerouted accidentally and I just found her note to me. I have increased her Adderall XR from  to  dose and the Rx is ready for her to pick up.

## 2015-04-05 ENCOUNTER — Ambulatory Visit (INDEPENDENT_AMBULATORY_CARE_PROVIDER_SITE_OTHER): Payer: 59 | Admitting: Obstetrics and Gynecology

## 2015-04-05 ENCOUNTER — Other Ambulatory Visit: Payer: Self-pay | Admitting: Obstetrics and Gynecology

## 2015-04-05 ENCOUNTER — Encounter: Payer: Self-pay | Admitting: Obstetrics and Gynecology

## 2015-04-05 ENCOUNTER — Other Ambulatory Visit: Payer: Self-pay | Admitting: *Deleted

## 2015-04-05 VITALS — BP 106/83 | HR 83 | Ht 63.0 in | Wt 209.2 lb

## 2015-04-05 DIAGNOSIS — Z01419 Encounter for gynecological examination (general) (routine) without abnormal findings: Secondary | ICD-10-CM

## 2015-04-05 NOTE — Patient Instructions (Addendum)
Thank you for enrolling in MyChart. Please follow the instructions below to securely access your online medical record. MyChart allows you to send messages to your doctor, view your test results, renew your prescriptions, schedule appointments, and more.  How Do I Sign Up? 1. In your Internet browser, go to [email or web address] 2. Click on the New  User? link in the Sign In box.  3. Enter your MyChart Access Code exactly as it appears below. You will not need to use this code after you have completed the sign-up process. If you do not sign up before the expiration date, you must request a new code. MyChart Access Code: Activation code not generated Current MyChart Status: Active  4. Enter the last four digits of your Social Security Number (xxxx) and Date of Birth (mm/dd/yyyy) as indicated and click Next. You will be taken to the next sign-up page. 5. Create a MyChart ID. This will be your MyChart login ID and cannot be changed, so think of one that is secure and easy to remember. 6. Create a MyChart password. You can change your password at any time. 7. Enter your Password Reset Question and Answer and click Next. This can be used at a later time if you forget your password.  8. Select your communication preference, and if applicable enter your e-mail address. You will receive e-mail notification when new information is available in MyChart by choosing to receive e-mail notifications and filling in your e-mail. 9. Click Sign In. You can now view your medical record.   Additional Information If you have questions, you can email [email or web address] or call [phone] to talk to our MyChart staff. Remember, MyChart is NOT to be used for urgent needs. For medical emergencies, dial 911.   Levonorgestrel intrauterine device (IUD) What is this medicine? LEVONORGESTREL IUD (LEE voe nor jes trel) is a contraceptive (birth control) device. The device is placed inside the  uterus by a healthcare professional. It is used to prevent pregnancy and can also be used to treat heavy bleeding that occurs during your period. Depending on the device, it can be used for 3 to 5 years. This medicine may be used for other purposes; ask your health care provider or pharmacist if you have questions. COMMON BRAND NAME(S): Elveria Royals What should I tell my health care provider before I take this medicine? They need to know if you have any of these conditions: -abnormal Pap smear -cancer of the breast, uterus, or cervix -diabetes -endometritis -genital or pelvic infection now or in the past -have more than one sexual partner or your partner has more than one partner -heart disease -history of an ectopic or tubal pregnancy -immune system problems -IUD in place -liver disease or tumor -problems with blood clots or take blood-thinners -use intravenous drugs -uterus of unusual shape -vaginal bleeding that has not been explained -an unusual or allergic reaction to levonorgestrel, other hormones, silicone, or polyethylene, medicines, foods, dyes, or preservatives -pregnant or trying to get pregnant -breast-feeding How should I use this medicine? This device is placed inside the uterus by a health care professional. Talk to your pediatrician regarding the use of this medicine in children. Special care may be needed. Overdosage: If you think you have taken too much of this medicine contact a poison control center or emergency room at once. NOTE: This medicine is only for you. Do not share this medicine with others. What if I miss a dose? This does not  apply. What may interact with this medicine? Do not take this medicine with any of the following medications: -amprenavir -bosentan -fosamprenavir This medicine may also interact with the following medications: -aprepitant -barbiturate medicines for inducing sleep or treating  seizures -bexarotene -griseofulvin -medicines to treat seizures like carbamazepine, ethotoin, felbamate, oxcarbazepine, phenytoin, topiramate -modafinil -pioglitazone -rifabutin -rifampin -rifapentine -some medicines to treat HIV infection like atazanavir, indinavir, lopinavir, nelfinavir, tipranavir, ritonavir -St. John's wort -warfarin This list may not describe all possible interactions. Give your health care provider a list of all the medicines, herbs, non-prescription drugs, or dietary supplements you use. Also tell them if you smoke, drink alcohol, or use illegal drugs. Some items may interact with your medicine. What should I watch for while using this medicine? Visit your doctor or health care professional for regular check ups. See your doctor if you or your partner has sexual contact with others, becomes HIV positive, or gets a sexual transmitted disease. This product does not protect you against HIV infection (AIDS) or other sexually transmitted diseases. You can check the placement of the IUD yourself by reaching up to the top of your vagina with clean fingers to feel the threads. Do not pull on the threads. It is a good habit to check placement after each menstrual period. Call your doctor right away if you feel more of the IUD than just the threads or if you cannot feel the threads at all. The IUD may come out by itself. You may become pregnant if the device comes out. If you notice that the IUD has come out use a backup birth control method like condoms and call your health care provider. Using tampons will not change the position of the IUD and are okay to use during your period. What side effects may I notice from receiving this medicine? Side effects that you should report to your doctor or health care professional as soon as possible: -allergic reactions like skin rash, itching or hives, swelling of the face, lips, or tongue -fever, flu-like symptoms -genital sores -high  blood pressure -no menstrual period for 6 weeks during use -pain, swelling, warmth in the leg -pelvic pain or tenderness -severe or sudden headache -signs of pregnancy -stomach cramping -sudden shortness of breath -trouble with balance, talking, or walking -unusual vaginal bleeding, discharge -yellowing of the eyes or skin Side effects that usually do not require medical attention (report to your doctor or health care professional if they continue or are bothersome): -acne -breast pain -change in sex drive or performance -changes in weight -cramping, dizziness, or faintness while the device is being inserted -headache -irregular menstrual bleeding within first 3 to 6 months of use -nausea This list may not describe all possible side effects. Call your doctor for medical advice about side effects. You may report side effects to FDA at 1-800-FDA-1088. Where should I keep my medicine? This does not apply. NOTE: This sheet is a summary. It may not cover all possible information. If you have questions about this medicine, talk to your doctor, pharmacist, or health care provider.  2015, Elsevier/Gold Standard. (2011-09-04 13:54:04)   Place annual gynecologic exam patient instructions here.

## 2015-04-05 NOTE — Progress Notes (Signed)
  Subjective:     Veronica Hurst is a 24 y.o. female and is here for a comprehensive physical exam. The patient reports heavy menses monthly.  Social History   Social History  . Marital Status: Single    Spouse Name: N/A  . Number of Children: 0  . Years of Education: N/A   Occupational History  . Not on file.   Social History Main Topics  . Smoking status: Never Smoker   . Smokeless tobacco: Not on file  . Alcohol Use: 0.0 oz/week    0 Standard drinks or equivalent per week     Comment: occassional  . Drug Use: Not on file  . Sexual Activity:    Partners: Male    Pharmacist, hospital Protection: None   Other Topics Concern  . Not on file   Social History Narrative   Health Maintenance  Topic Date Due  . HIV Screening  05/26/2007  . PAP SMEAR  05/25/2010  . TETANUS/TDAP  05/26/2011  . INFLUENZA VACCINE  03/19/2015    The following portions of the patient's history were reviewed and updated as appropriate: allergies, current medications, past family history, past medical history, past social history, past surgical history and problem list.  Review of Systems A comprehensive review of systems was negative.   Objective:    General appearance: alert, cooperative, appears stated age and mildly obese Neck: no adenopathy, no carotid bruit, no JVD, supple, symmetrical, trachea midline and thyroid not enlarged, symmetric, no tenderness/mass/nodules Lungs: clear to auscultation bilaterally Breasts: normal appearance, no masses or tenderness Heart: regular rate and rhythm, S1, S2 normal, no murmur, click, rub or gallop Abdomen: soft, non-tender; bowel sounds normal; no masses,  no organomegaly Pelvic: cervix normal in appearance, external genitalia normal, no adnexal masses or tenderness, no cervical motion tenderness, rectovaginal septum normal, uterus normal size, shape, and consistency and vagina normal without discharge    Assessment:    Healthy female exam. Obesity;  contraception counseling      Plan:  Routine pap; labs already obtained by PCP; considering Skyla- info given to review; getting married in 2 months and didn't tolerate OCPs well.   See After Visit Summary for Counseling Recommendations

## 2015-04-06 LAB — CYTOLOGY - PAP

## 2015-04-09 ENCOUNTER — Encounter: Payer: Self-pay | Admitting: *Deleted

## 2015-05-01 ENCOUNTER — Other Ambulatory Visit: Payer: Self-pay

## 2015-05-17 ENCOUNTER — Other Ambulatory Visit: Payer: Self-pay | Admitting: Family Medicine

## 2015-05-25 ENCOUNTER — Other Ambulatory Visit: Payer: Self-pay | Admitting: Family Medicine

## 2015-05-25 DIAGNOSIS — F900 Attention-deficit hyperactivity disorder, predominantly inattentive type: Secondary | ICD-10-CM

## 2015-05-25 NOTE — Telephone Encounter (Signed)
Pt called today to get a updated status on her refill for Adderell. She would like a call back. She sent a message through MyChart on 05/17/15 but it may not have came through to [place] correctly.

## 2015-05-28 MED ORDER — AMPHETAMINE-DEXTROAMPHET ER 20 MG PO CP24: 20.0000 mg | ORAL_CAPSULE | ORAL | Status: DC

## 2015-05-28 NOTE — Telephone Encounter (Signed)
Patient called back and was informed of her prescription being ready and that she need to schedule appointment. She will call back once she return from her honeymoon

## 2015-05-28 NOTE — Telephone Encounter (Signed)
Left voice message for patient to schedule appointment. °

## 2015-05-28 NOTE — Telephone Encounter (Signed)
Refill request was sent to Dr. Krichna Sowles for approval and submission.  

## 2015-07-10 ENCOUNTER — Encounter: Payer: Self-pay | Admitting: Family Medicine

## 2015-07-10 ENCOUNTER — Ambulatory Visit (INDEPENDENT_AMBULATORY_CARE_PROVIDER_SITE_OTHER): Payer: 59 | Admitting: Family Medicine

## 2015-07-10 VITALS — BP 120/68 | HR 110 | Temp 98.1°F | Resp 16 | Ht 63.0 in | Wt 216.7 lb

## 2015-07-10 DIAGNOSIS — G43009 Migraine without aura, not intractable, without status migrainosus: Secondary | ICD-10-CM | POA: Diagnosis not present

## 2015-07-10 DIAGNOSIS — F900 Attention-deficit hyperactivity disorder, predominantly inattentive type: Secondary | ICD-10-CM | POA: Diagnosis not present

## 2015-07-10 DIAGNOSIS — E669 Obesity, unspecified: Secondary | ICD-10-CM

## 2015-07-10 MED ORDER — AMPHETAMINE-DEXTROAMPHET ER 15 MG PO CP24: 15.0000 mg | ORAL_CAPSULE | ORAL | Status: DC

## 2015-07-10 MED ORDER — SUMATRIPTAN SUCCINATE 100 MG PO TABS
ORAL_TABLET | ORAL | Status: DC
Start: 2015-07-10 — End: 2015-07-20

## 2015-07-10 NOTE — Progress Notes (Signed)
Name: Veronica Hurst   MRN: [id number]    DOB: [birth year]   Date:07/10/2015       Progress Note  Subjective  Chief Complaint  Chief Complaint  Patient presents with  . Follow-up    Medication refill of adderall. Patient wants to discuss increasing the dose.  . Migraine    Patient states this causes her to feel nausea    HPI  Attention Deficit Disorder: The condition is characterized as fidgeting, loses items necessary for activity, interrupting others, poor attention span, shifting from one uncompleted task to another and easily distracted but not excessive talking, difficulty remaining seated, difficulty waiting for her turn, engages in physically dangerous activities, difficulty waiting for her turn, blurting out answers before question is complete, difficulty following instructions or antisocial behavior. There has been no associated hearing difficulties, vision disturbances other than anxiety. She currently has been on Adderall  po bid.  Headache: Patient presents with complaints of chronic headache. Symptoms began many years ago, nearly weekly in the past few months. Generally, the headaches last about 1 day and occur frequent. The headache seem to start at 12pm and can sometimes last through the next morning but not much longer than that. The headaches are usually squeezing and throbbing and are frontal in location.  The patient rates her most severe headaches a 10 on a scale from 1 to 10. Recently, the headaches are increasing in both severity and frequency. Work attendance or other daily activities are affected by the headaches. Precipitating factors include possibly diet, caffeine withdrawal, stress, URI symptoms and weather changes. The headaches are usually not preceded by an aura. Associated neurologic symptoms which are present include: anxiety. The patient denies dizziness, loss of balance, muscle weakness, numbness of extremities, speech difficulties, vision problems and vomiting  in the early morning. Other associated symptoms include: fatigue and irritability.  Symptoms which are not present include: chest pain, conjunctivitis, cough, earache, fever, nasal congestion, rash, sore throat, vomiting and wheezing. Home treatment has included acetaminophen and ibuprofen, darkening the room, resting and sleeping with inadequate improvement. Other history includes: migraine headaches diagnosed in the past. Family history includes migraine headaches in parents.  Obesity:  The patient is appropriately concerned about their weight. Onset of weight issues started several years ago but more resently since being newly wed she has gained more weight. She works in [employer] for [employer] and finds it hard to work all day from [employer] and then exercise. Highest recorded weight 216 lbs. Associated symptoms or diseases do not include pain in weight bearing joints, HTN, HLD, DMII, skin lesions, snoring, sleep apnea. However she is tearful and does report poor self esteem. Current efforts to reduce weight include nothing.   Past Medical History  Diagnosis Date  . Allergy   . Anxiety   . Hyperlipidemia LDL goal <100 03/06/2015    Patient Active Problem List   Diagnosis Date Noted  . Obesity, Class II, BMI 35-39.9, isolated (HCC) 03/06/2015  . Attention deficit hyperactivity disorder (ADHD), inattentive type, moderate 03/06/2015  . Anxiety disorder 03/06/2015  . Other fatigue 03/06/2015  . Hyperlipidemia LDL goal <100 03/06/2015  . Screening for diabetes mellitus (DM) 03/06/2015  . Herpes zona 11/07/2013  . Acne inversa 10/11/2012    Social History  Substance Use Topics  . Smoking status: Never Smoker   . Smokeless tobacco: Not on file  . Alcohol Use: 0.0 oz/week    0 Standard drinks or equivalent per week     Comment:  occassional     Current outpatient prescriptions:  .  amphetamine-dextroamphetamine (ADDERALL XR) 20 MG 24 hr capsule, Take 1 capsule (20 mg total) by mouth every  morning., Disp: 30 capsule, Rfl: 0 .  clindamycin (CLINDAGEL) 1 % gel, Apply topically., Disp: , Rfl:  .  doxycycline (VIBRA-TABS) 100 MG tablet, Take 100 mg by mouth 2 (two) times daily., Disp: , Rfl:  .  sulfamethoxazole-trimethoprim (BACTRIM DS,SEPTRA DS) 800-160 MG per tablet, Take 1 tablet by mouth 2 (two) times daily., Disp: , Rfl:   Past Surgical History  Procedure Laterality Date  . Tonsillectomy    . Incision and drainage abscess N/A     abdomen    Family History  Problem Relation Age of Onset  . Hyperlipidemia Mother   . Hypertension Father   . Heart disease Father     on dad's side  . Cancer Maternal Grandfather     No Known Allergies   Review of Systems  CONSTITUTIONAL: Yes weight gain. No fever, chills, weakness or fatigue.  CARDIOVASCULAR: No chest pain, chest pressure or chest discomfort. No palpitations or edema.  RESPIRATORY: No shortness of breath, cough or sputum.  GASTROINTESTINAL: No anorexia, nausea, vomiting. No changes in bowel habits. No abdominal pain or blood.  NEUROLOGICAL: Yes headaches. No dizziness, syncope, paralysis, ataxia, numbness or tingling in the extremities. No memory changes. No change in bowel or bladder control.  MUSCULOSKELETAL: No joint pain. No muscle pain. HEMATOLOGIC: No anemia, bleeding or bruising.  LYMPHATICS: No enlarged lymph nodes.  PSYCHIATRIC: No change in mood. No change in sleep pattern.  ENDOCRINOLOGIC: No reports of sweating, cold or heat intolerance. No polyuria or polydipsia.     Objective  BP 120/68 mmHg  Pulse 110  Temp(Src) 98.1 F (36.7 C) (Oral)  Resp 16  Ht  (1.6 m)  Wt 216 lb 11.2 oz (98.294 kg)  BMI 38.40 kg/m2  SpO2 98%  LMP 06/11/2015 (Approximate) Body mass index is 38.4 kg/(m^2).  Physical Exam  Constitutional: Patient appears obese and well-nourished. In no distress.  Cardiovascular: Normal rate, regular rhythm and normal heart sounds.  No murmur heard.  Pulmonary/Chest: Effort  normal and breath sounds normal. No respiratory distress. Musculoskeletal: Normal range of motion bilateral UE and LE, no joint effusions. Peripheral vascular: Bilateral LE no edema. Neurological: CN II-XII grossly intact with no focal deficits. Alert and oriented to person, place, and time. Coordination, balance, strength, speech and gait are normal.  Skin: Skin is warm and dry. No rash noted. No erythema.  Psychiatric: Patient has a tearful mood and affect when discussing her weight. Behavior is normal in office today. Judgment and thought content normal in office today.   Assessment & Plan  1. Attention deficit hyperactivity disorder (ADHD), inattentive type, moderate Headaches may be related to Adderall use, she is willing to reduce dose from 20 mg to 15 mg to see if her headache symptoms reduce in frequency. A one month supply provided.  - amphetamine-dextroamphetamine (ADDERALL XR) 15 MG 24 hr capsule; Take 1 capsule by mouth every morning.  Dispense: 30 capsule; Refill: 0  2. Migraine without aura and without status migrainosus, not intractable Trial of Imitrex PRN.   - SUMAtriptan (IMITREX) 100 MG tablet; Take 0.5-1 tablet by mouth onset of headache, may repeat x1 about 1hr later prn, max /day  Dispense: 15 tablet; Refill: 3  3. Obesity, Class II, BMI 35-39.9, isolated (HCC) If headaches not well controlled Topiramate daily may be useful for headache prophylaxis as  well as appetite suppression.  The patient has been counseled on their higher than normal BMI.  They have verbally expressed understanding their increased risk for other diseases.  In efforts to meet a better target BMI goal the patient has been counseled on lifestyle, diet and exercise modification tactics. Start with moderate intensity aerobic exercise (walking, jogging, elliptical, swimming, group or individual sports, hiking) at least a day at least 4 days a week and increase intensity, duration, frequency as  tolerated. Diet should include well balance fresh fruits and vegetables avoiding processed foods, carbohydrates and sugars. Drink at least 8oz 10 glasses a day avoiding sodas, sugary fruit drinks, sweetened tea. Check weight on a reliable scale daily and monitor weight loss progress daily. Consider investing in mobile phone apps that will help keep track of weight loss goals.

## 2015-07-10 NOTE — Patient Instructions (Signed)

## 2015-07-19 ENCOUNTER — Telehealth: Payer: Self-pay

## 2015-07-19 NOTE — Telephone Encounter (Signed)
Patient was informed of Dr. Debby FreibergSundaram's message and wants to try Fioricet since she had such a bad reaction to the Imitrex.   [facility]

## 2015-07-19 NOTE — Telephone Encounter (Signed)
Patient called stating that last night she had a headache so she took the Imitrex but had a reaction. She stated that she thought she was dying. Patient stated that her scalp felt like it was burning. She tried to stand up and almost passed out. She was sweating from hot flashes and she still has the headache.  Patient stated that if needed she can come in because she is off. I informed her that I was sending this message to Dr. Sherley BoundsSundaram and will give her a call back as soon as she responds. She said ok and then gave me perrmission to leave a  message on her voicemail.

## 2015-07-19 NOTE — Telephone Encounter (Signed)
Stop Imitrex 100 mg dose use. Options are to try a lower dose such as 25mg s or cut her current 100mg  pill in half to make 50 mg. If she is reluctant to try this medication at all again then I can also try sending in a new Rx for Fioricet (without codeine).

## 2015-07-20 ENCOUNTER — Other Ambulatory Visit: Payer: Self-pay | Admitting: Family Medicine

## 2015-07-20 MED ORDER — BUTALBITAL-APAP-CAFFEINE 50-325-40 MG PO TABS: 1.0000 | ORAL_TABLET | Freq: Four times a day (QID) | ORAL | Status: DC | PRN

## 2015-07-20 NOTE — Telephone Encounter (Signed)
Rx provided.

## 2015-08-08 ENCOUNTER — Ambulatory Visit (INDEPENDENT_AMBULATORY_CARE_PROVIDER_SITE_OTHER): Payer: 59 | Admitting: Family Medicine

## 2015-08-08 ENCOUNTER — Encounter: Payer: Self-pay | Admitting: Family Medicine

## 2015-08-08 VITALS — BP 114/74 | HR 110 | Temp 98.2°F | Resp 16 | Ht 63.0 in | Wt 220.8 lb

## 2015-08-08 DIAGNOSIS — E669 Obesity, unspecified: Secondary | ICD-10-CM

## 2015-08-08 DIAGNOSIS — G43009 Migraine without aura, not intractable, without status migrainosus: Secondary | ICD-10-CM | POA: Diagnosis not present

## 2015-08-08 DIAGNOSIS — J069 Acute upper respiratory infection, unspecified: Secondary | ICD-10-CM

## 2015-08-08 DIAGNOSIS — F900 Attention-deficit hyperactivity disorder, predominantly inattentive type: Secondary | ICD-10-CM

## 2015-08-08 MED ORDER — AMOXICILLIN 875 MG PO TABS: 875.0000 mg | ORAL_TABLET | Freq: Two times a day (BID) | ORAL | Status: DC

## 2015-08-08 MED ORDER — LORCASERIN HCL 10 MG PO TABS: 10.0000 mg | ORAL_TABLET | Freq: Two times a day (BID) | ORAL | Status: DC

## 2015-08-08 MED ORDER — AMPHETAMINE-DEXTROAMPHETAMINE 20 MG PO TABS: 20.0000 mg | ORAL_TABLET | Freq: Two times a day (BID) | ORAL | Status: DC

## 2015-08-08 MED ORDER — TOPIRAMATE 25 MG PO TABS: 25.0000 mg | ORAL_TABLET | Freq: Every day | ORAL | Status: DC

## 2015-08-08 NOTE — Progress Notes (Signed)
Name: Veronica Hurst   MRN: [id number]    DOB: 07/14/1991   Date:08/08/2015       Progress Note  Subjective  Chief Complaint  Chief Complaint  Patient presents with  . Migraine    patient states decresed dose of Adderall with no relief of headaches.  Never picked up the foricet because of the side efffect she had to itmtrex(states she couldnt walk, dizziness, hot flashes and sweats) she was scared.  Headaches located front of forhead and neck, thinks it may be due to working in front of computer screen.   Marland Kitchen. URI    onset today, deep cough, and hoarsness    HPI  Attention Deficit Disorder: The condition is characterized as fidgeting, loses items necessary for activity, interrupting others, poor attention span, shifting from one uncompleted task to another and easily distracted but not excessive talking, difficulty remaining seated, difficulty waiting for her turn, engages in physically dangerous activities, difficulty waiting for her turn, blurting out answers before question is complete, difficulty following instructions or antisocial behavior. There has been no associated hearing difficulties, vision disturbances other than anxiety. She was having headaces so last month I reduced her dose of Adderall to 15 mg po bid. She reports no improvement in headaches, but has worsening attention span.   Headache: Patient presents with complaints of chronic headache. Symptoms began many years ago, nearly weekly in the past few months. Generally, the headaches last about 1 day and occur frequent. The headache seem to start at 12pm and can sometimes last through the next morning but not much longer than that. The headaches are usually squeezing and throbbing and are frontal in location. The patient rates her most severe headaches a 10 on a scale from 1 to 10. Recently, the headaches are increasing in both severity and frequency. Work attendance or other daily activities are affected by the headaches.  Precipitating factors include possibly diet, caffeine withdrawal, stress, URI symptoms and weather changes. The headaches are usually not preceded by an aura. Associated neurologic symptoms which are present include: anxiety. The patient denies dizziness, loss of balance, muscle weakness, numbness of extremities, speech difficulties, vision problems and vomiting in the early morning. Did have her eyes checked, vision exam, everything stable. Other associated symptoms include: fatigue and irritability. Symptoms which are not present include: chest pain, conjunctivitis, cough, earache, fever, nasal congestion, rash, sore throat, vomiting and wheezing. Home treatment has included acetaminophen and ibuprofen, darkening the room, resting and sleeping with inadequate improvement. Other history includes: migraine headaches diagnosed in the past. Family history includes migraine headaches in parents. She tried the Imitrex and did not like how it made her feel so she was reluctant to try the Fioricet. She is considering her headaches are work/stress related. 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 Onset of weight issues started several years ago but more resently since being newly wed she has gained more weight. [place] [employer] and finds it hard to work all day from [employer]-5pm and then exercise. Highest recorded weight 220 lbs. Associated symptoms or diseases do not include pain in weight bearing joints, HTN, HLD, DMII, skin lesions, snoring, sleep apnea. However she does report poor self esteem. Current efforts to reduce weight include nothing. Adipex has not worked in the past.   URI: Today onset of cough with nasal congestion. No fevers, rash, sick contacts.   Past Medical History  Diagnosis  Date  . Allergy   . Anxiety   . Hyperlipidemia LDL goal <100 03/06/2015    Patient Active Problem List   Diagnosis Date Noted   . Migraine without aura and without status migrainosus, not intractable 07/10/2015  . Obesity, Class II, BMI 35-39.9, isolated (HCC) 03/06/2015  . Attention deficit hyperactivity disorder (ADHD), inattentive type, moderate 03/06/2015  . Anxiety disorder 03/06/2015  . Other fatigue 03/06/2015  . Hyperlipidemia LDL goal <100 03/06/2015  . Screening for diabetes mellitus (DM) 03/06/2015  . Herpes zona 11/07/2013  . Acne inversa 10/11/2012    Social History  Substance Use Topics  . Smoking status: Never Smoker   . Smokeless tobacco: Not on file  . Alcohol Use: 0.0 oz/week    0 Standard drinks or equivalent per week     Comment: occassional     Current outpatient prescriptions:  .  amphetamine-dextroamphetamine (ADDERALL XR) 15 MG 24 hr capsule, Take 1 capsule by mouth every morning., Disp: 30 capsule, Rfl: 0  Past Surgical History  Procedure Laterality Date  . Tonsillectomy    . Incision and drainage abscess N/A     abdomen    Family History  Problem Relation Age of Onset  . Hyperlipidemia Mother   . Hypertension Father   . Heart disease Father     on dad's side  . Cancer Maternal Grandfather     No Known Allergies   Review of Systems  CONSTITUTIONAL: No significant weight changes, fever, chills, weakness or fatigue.  HEENT:  - Eyes: No visual changes.  - Ears: No auditory changes. No pain.  - Nose: No sneezing. Yes congestion. No runny nose. - Throat: No sore throat. No changes in swallowing. SKIN: No rash or itching.  CARDIOVASCULAR: No chest pain, chest pressure or chest discomfort. No palpitations or edema.  RESPIRATORY: No shortness of breath. Yes cough without sputum.  GASTROINTESTINAL: No anorexia, nausea, vomiting. No changes in bowel habits. No abdominal pain or blood.  NEUROLOGICAL: Yes headaches. No dizziness, syncope, paralysis, ataxia, numbness or tingling in the extremities. No memory changes. No change in bowel or bladder control.  MUSCULOSKELETAL:  No joint pain. No muscle pain. PSYCHIATRIC: No change in mood. No change in sleep pattern.  ENDOCRINOLOGIC: No reports of sweating, cold or heat intolerance. No polyuria or polydipsia.     Objective  BP 114/74 mmHg  Pulse 110  Temp(Src) 98.2 F (36.8 C) (Oral)  Resp 16  Ht  (1.6 m)  Wt 220 lb 12.8 oz (100.154 kg)  BMI 39.12 kg/m2  SpO2 98%  LMP 07/12/2015 Body mass index is 39.12 kg/(m^2).  Physical Exam  Constitutional: Patient appears obese and well-nourished. In no distress.  HEENT:  - Head: Normocephalic and atraumatic.  - Ears: Bilateral TMs gray, no erythema or effusion - Nose: Nasal mucosa moist, boggy - Mouth/Throat: Oropharynx is clear and moist. No tonsillar hypertrophy or erythema. No post nasal drainage.  - Eyes: Conjunctivae clear, EOM movements normal. PERRLA. No scleral icterus.  Neck: Normal range of motion. Neck supple. No JVD present. No thyromegaly present.  Cardiovascular: Normal rate, regular rhythm and normal heart sounds. No murmur heard.  Pulmonary/Chest: Effort normal and breath sounds normal. No respiratory distress. Musculoskeletal: Normal range of motion bilateral UE and LE, no joint effusions. Peripheral vascular: Bilateral LE no edema. Neurological: CN II-XII grossly intact with no focal deficits. Alert and oriented to person, place, and time. Coordination, balance, strength, speech and gait are normal.  Skin: Skin is warm and  dry. No rash noted. No erythema.  Psychiatric: Patient has a stable happy mood and affect when discussing her weight. Behavior is normal in office today. Judgment and thought content normal in office today.  Assessment & Plan  1. Attention deficit hyperactivity disorder (ADHD), inattentive type, moderate No improvement in headaches since reducing Adderall dose, attention span worse, we will return to 20 mg dose.  - amphetamine-dextroamphetamine (ADDERALL) 20 MG tablet; Take 1 tablet (20 mg total) by mouth 2 (two)  times daily.  Dispense: 60 tablet; Refill: 0  2. Migraine without aura and without status migrainosus, not intractable May be work, tension, stress related. Encouraged her to try Fioricet. Start Topamax at bedtime, may help with weight loss. The patient has been counseled on the proper use, side effects and potential interactions of the new medication. Patient encouraged to review the side effects and safety profile pamphlet provided with the prescription from the pharmacy as well as request counseling from the pharmacy team as needed.     - topiramate (TOPAMAX) 25 MG tablet; Take 1 tablet (25 mg total) by mouth at bedtime.  Dispense: 30 tablet; Refill: 3  3. Obesity, Class II, BMI 35-39.9, isolated (HCC) Still gaining weight. Adipex has not helped in the past. Will try her on Belviq. The patient has been counseled on the proper use, side effects and potential interactions of the new medication. Patient encouraged to review the side effects and safety profile pamphlet provided with the prescription from the pharmacy as well as request counseling from the pharmacy team as needed.   - Lorcaserin HCl (BELVIQ) 10 MG TABS; Take 10 mg by mouth 2 (two) times daily.  Dispense: 60 tablet; Refill: 3  4. Upper respiratory infection Likely viral, symptomatic remedy OTC recommended, coupons given. If symptoms progress may use amoxicillin Rx given today.   - amoxicillin (AMOXIL) 875 MG tablet; Take 1 tablet (875 mg total) by mouth 2 (two) times daily.  Dispense: 20 tablet; Refill: 0

## 2015-09-13 ENCOUNTER — Other Ambulatory Visit: Payer: Self-pay | Admitting: Family Medicine

## 2015-09-14 ENCOUNTER — Other Ambulatory Visit: Payer: Self-pay | Admitting: Family Medicine

## 2015-09-14 DIAGNOSIS — E669 Obesity, unspecified: Secondary | ICD-10-CM

## 2015-09-14 DIAGNOSIS — F900 Attention-deficit hyperactivity disorder, predominantly inattentive type: Secondary | ICD-10-CM

## 2015-09-14 MED ORDER — AMPHETAMINE-DEXTROAMPHETAMINE 20 MG PO TABS: 20.0000 mg | ORAL_TABLET | Freq: Two times a day (BID) | ORAL | Status: DC

## 2015-09-14 MED ORDER — LORCASERIN HCL 10 MG PO TABS: 10.0000 mg | ORAL_TABLET | Freq: Two times a day (BID) | ORAL | Status: DC

## 2015-10-22 ENCOUNTER — Telehealth: Payer: Self-pay

## 2015-10-22 DIAGNOSIS — E669 Obesity, unspecified: Secondary | ICD-10-CM

## 2015-10-22 NOTE — Telephone Encounter (Signed)
Patients Ins. Will not cover Belviq.  U can try Contrave?

## 2015-10-24 MED ORDER — CONTRAVE 8-90 MG PO TB12: ORAL_TABLET | ORAL | Status: DC

## 2015-10-24 NOTE — Addendum Note (Signed)
Addended by: Edwena FeltySUNDARAM, Gracen Ringwald on: 10/24/2015 08:57 AM   Modules accepted: Orders, Medications

## 2015-10-24 NOTE — Telephone Encounter (Signed)
Contrave printed out.

## 2015-11-05 DIAGNOSIS — L239 Allergic contact dermatitis, unspecified cause: Secondary | ICD-10-CM | POA: Diagnosis not present

## 2015-12-21 ENCOUNTER — Encounter: Payer: Self-pay | Admitting: Family Medicine

## 2015-12-21 ENCOUNTER — Ambulatory Visit (INDEPENDENT_AMBULATORY_CARE_PROVIDER_SITE_OTHER): Payer: 59 | Admitting: Family Medicine

## 2015-12-21 VITALS — BP 122/84 | HR 102 | Temp 98.3°F | Resp 16 | Wt 221.0 lb

## 2015-12-21 DIAGNOSIS — E66812 Obesity, class 2: Secondary | ICD-10-CM

## 2015-12-21 DIAGNOSIS — F900 Attention-deficit hyperactivity disorder, predominantly inattentive type: Secondary | ICD-10-CM

## 2015-12-21 DIAGNOSIS — E669 Obesity, unspecified: Secondary | ICD-10-CM

## 2015-12-21 DIAGNOSIS — R000 Tachycardia, unspecified: Secondary | ICD-10-CM

## 2015-12-21 DIAGNOSIS — R5383 Other fatigue: Secondary | ICD-10-CM

## 2015-12-21 HISTORY — DX: Tachycardia, unspecified: R00.0

## 2015-12-21 NOTE — Assessment & Plan Note (Signed)
Has not seen nutritionist yet; stop Belviq for now

## 2015-12-21 NOTE — Assessment & Plan Note (Signed)
Check EKG today.  

## 2015-12-21 NOTE — Progress Notes (Signed)
BP 122/84 mmHg  Pulse 102  Temp(Src) 98.3 F (36.8 C) (Oral)  Resp 16  Wt 221 lb (100.245 kg)  SpO2 98%  LMP 12/13/2015 (Approximate)   Subjective:    Patient ID: Veronica RidgeLandon E Slotnick, female    DOB: 12/22/1990, 25 y.o.   MRN: [id number]  HPI: Veronica Hurst is a 25 y.o. female  Chief Complaint  Patient presents with  . Medication Refill  . Fatigue    patient states no energy, weight gain, and sleeps alot  . Sexual Problem    low sex drive   Patient is new to me; her previous provider left this practice She has racing heart rate at times She will push 150-160 heart rate on the treadmill when exercising She did the boot camp with [employer]; did get light-headed and almost passed out and stopped doing it; it scared her; that was last summer She has not had an EKG [facility]; sees Melody AvocaBurr, CNM No one in the family has ever had a pacemaker; she says heart issues run in her family; PGF bypass in his 8570's; father is on HTN medicine  Lab Results  Component Value Date   WBC 8.1 01/07/2016   HGB 12.0 12/30/2013   HCT 40.5 01/07/2016   MCV 86 01/07/2016   PLT 267 01/07/2016   Lab Results  Component Value Date   TSH 3.110 01/07/2016   She says that Dr. Sherley BoundsSundaram started her on the adderall; [facility] stopped her adderall when they put on her phentermine; trouble focusing; did see someone and diagnosed with ADHD; around 2014; on the adderall, does not feel like it races even more; notices it when not on adderall  Has only taken one month of Belviq in Feb/March, had an issue and needed prior auth; did not pick up the 2nd prescription, no changes in the heart issues on Belviq  [facility] 12/21/2015 07/10/2015 03/06/2015  Decreased Interest 0 0 1  Down, Depressed, Hopeless 1 1 0  PHQ - 2 Score 1 1 1    Relevant past medical, surgical, family and social history reviewed Past Medical History  Diagnosis Date  . Allergy   . Anxiety     . Hyperlipidemia LDL goal <100 03/06/2015  . Tachycardia 12/21/2015  . Obesity (BMI 30-39.9)    Past Surgical History  Procedure Laterality Date  . Tonsillectomy    . Incision and drainage abscess N/A     abdomen   Family History  Problem Relation Age of Onset  . Hyperlipidemia Mother   . Hypertension Father   . Heart disease Father     on dad's side  . Cancer Maternal Grandfather   aunts and uncles on maternal side had cancer too  Social History  Substance Use Topics  . Smoking status: Never Smoker   . Smokeless tobacco: None  . Alcohol Use: 0.0 oz/week    0 Standard drinks or equivalent per week     Comment: occassional   Interim medical history since last visit reviewed. Allergies and medications reviewed  Review of Systems Per HPI unless specifically indicated above     Objective:    BP 122/84 mmHg  Pulse 102  Temp(Src) 98.3 F (36.8 C) (Oral)  Resp 16  Wt 221 lb (100.245 kg)  SpO2 98%  LMP 12/13/2015 (Approximate)  Wt Readings from Last 3 Encounters:  01/04/16 229 lb (103.874 kg)  12/21/15 221 lb (100.245 kg)  08/08/15 220 lb 12.8  oz (100.154 kg)   body mass index is 39.16 kg/(m^2).  Physical Exam  Constitutional: She appears well-developed and well-nourished. No distress.  HENT:  Head: Normocephalic and atraumatic.  Eyes: EOM are normal. No scleral icterus.  Neck: No JVD present. No thyromegaly present.  Cardiovascular: Normal rate, regular rhythm and normal heart sounds.  Exam reveals no gallop and no friction rub.   No murmur heard. Pulmonary/Chest: Effort normal and breath sounds normal. No respiratory distress.  Abdominal: Soft. Bowel sounds are normal. She exhibits no distension.  Musculoskeletal: Normal range of motion. She exhibits no edema.  Neurological: She is alert. She exhibits normal muscle tone.  Skin: Skin is warm and dry. She is not diaphoretic. No pallor.  Psychiatric: She has a normal mood and affect. Her behavior is normal.  Judgment and thought content normal.      Assessment & Plan:   Problem List Items Addressed This Visit      Other   Attention deficit hyperactivity disorder (ADHD), inattentive type, moderate    Formal evaluation by specialist in [place]; with her tachycardia, will stop the stimulant medication for now; consider wellbutrin; recommended [employer] book, [place]      Morbid obesity with BMI of 40.0-44.9, adult (HCC)    Has not seen nutritionist yet; stop Belviq for now      Other fatigue   Relevant Orders   Vitamin B12 (Completed)   VITAMIN D 25 Hydroxy (Vit-D Deficiency, Fractures) (Completed)   Tachycardia - Primary    Check EKG today      Relevant Orders   EKG 12-Lead (Completed)   Holter monitor - 24 hour   Echocardiogram (Completed)   CBC with Differential/Platelet (Completed)   TSH (Completed)   T4, free (Completed)      Follow up plan: Return in about 2 weeks (around 01/04/2016) for follow-up, end of day or first thing in morning.  An after-visit summary was printed and given to the patient at check-out.  Please see the patient instructions which may contain other information and recommendations beyond what is mentioned above in the assessment and plan.  Meds ordered this encounter  Medications  . DISCONTD: BELVIQ 10 MG TABS    Sig:     Refill:  0    Orders Placed This Encounter  Procedures  . CBC with Differential/Platelet  . TSH  . T4, free  . Vitamin B12  . VITAMIN D 25 Hydroxy (Vit-D Deficiency, Fractures)  . Amb ref to Medical Nutrition Therapy-MNT  . Holter monitor - 24 hour  . EKG 12-Lead  . Echocardiogram

## 2015-12-21 NOTE — Patient Instructions (Addendum)
We'll refer you for an echocardiogram and holter monitor We'll have you work with the nutritionist I'll recommend that you don't take your Adderall for now while we're working up your tachycardia  Check out the information at familydoctor.org (see hand-out and other related articles on-line) Try to lose between 1-2 pounds per week by taking in fewer calories and burning off more calories You can succeed by limiting portions, limiting foods dense in calories and fat, becoming more active, and drinking 8 glasses of water a day (64 ounces) Don't skip meals, especially breakfast, as skipping meals may alter your metabolism Do not use over-the-counter weight loss pills or gimmicks that claim rapid weight loss A healthy BMI (or body mass index) is between 18.5 and 24.9 You can calculate your ideal BMI at the NIH website [employer]huhttp://www.nhlbi.nih.gov/health/educational/lose_wt/BMI/bmicalc.htm  On another [place]

## 2015-12-21 NOTE — Assessment & Plan Note (Signed)
[place]ormal [place] by specialist in [place]; with her tachycardia, will stop the stimulant medication for now; consider wellbutrin; recommended [place] [employer] [place] book, [place]

## 2015-12-31 ENCOUNTER — Ambulatory Visit
Admission: RE | Admit: 2015-12-31 | Discharge: 2015-12-31 | Disposition: A | Discharge status: Home / Self Care | Payer: 59 | Source: Ambulatory Visit | Attending: Family Medicine | Admitting: Family Medicine

## 2015-12-31 DIAGNOSIS — R000 Tachycardia, unspecified: Secondary | ICD-10-CM | POA: Insufficient documentation

## 2015-12-31 NOTE — Progress Notes (Signed)
*  PRELIMINARY RESULTS* Echocardiogram 2D Echocardiogram has been performed.  Georgann HousekeeperJerry R Hege 12/31/2015, 10:23 AM

## 2016-01-04 ENCOUNTER — Encounter: Payer: Self-pay | Admitting: Family Medicine

## 2016-01-04 ENCOUNTER — Ambulatory Visit (INDEPENDENT_AMBULATORY_CARE_PROVIDER_SITE_OTHER): Payer: 59 | Admitting: Family Medicine

## 2016-01-04 VITALS — BP 118/84 | HR 98 | Temp 98.2°F | Resp 14 | Wt 229.0 lb

## 2016-01-04 DIAGNOSIS — R000 Tachycardia, unspecified: Secondary | ICD-10-CM | POA: Diagnosis not present

## 2016-01-04 DIAGNOSIS — Z6841 Body Mass Index (BMI) 40.0 and over, adult: Secondary | ICD-10-CM | POA: Diagnosis not present

## 2016-01-04 NOTE — Progress Notes (Signed)
BP 118/84 mmHg  Pulse 98  Temp(Src) 98.2 F (36.8 C) (Oral)  Resp 14  Wt 229 lb (103.874 kg)  SpO2 98%  LMP 12/13/2015 (Approximate)   Subjective:    Patient ID: Veronica Hurst, female    DOB: 06/05/1991, 25 y.o.   MRN: [id number]  HPI: Veronica Hurst is a 25 y.o. female  Chief Complaint  Patient presents with  . Follow-up    2 weeks   Patient is here for f/u She is so discouraged -->computer locked up after that and the rest of our visit was limited in that I could not take notes other than on paper, could not look up old labs, change screens, and could not print out an after visit summary for her  Addendum: Patient has been struggling to lose weight; she is drinking water; she is trying to walk; walks her dog around the yard, but not really exercising, per se; she would like to be able to see a nutritionist, but would have to ask off from work to do so; she is much more sedentary at her job now; used to move more, but sits most of the day now; she does drink some sweet tea, but mixes it half and half with unsweetened tea and has just one drink a day; she does use mayaonnaise; she is still gaining weight She tried herbalife; she tried Belviq for one month, but that did not help her at all; she used phentermine in the past She has read more about other products and she thinks the Qsymia might be helpful  She noticed some quivering heart palpitations after returning her holter monitor; lasted only about 10 seconds; she did not have any associated chest pain or SHOB; the holter monitor has been returned, but I cannot view results; staff checked on their computer, but results are not back yet; she has not gone to get her labs done yet but says she'll go next week  Dep[facility] screen [facility] 2/9 12/21/2015 07/10/2015 03/06/2015  Decreased Interest 0 0 1  Down, Depressed, Hopeless 1 1 0  PHQ - 2 Score 1 1 1    Relevant past medical, surgical, family and social history reviewed Past Medical  History  Diagnosis Date  . Allergy   . Anxiety   . Hyperlipidemia LDL goal <100 03/06/2015  . Tachycardia 12/21/2015  . Obesity (BMI 30-39.9)    Social History  Substance Use Topics  . Smoking status: Never Smoker   . Smokeless tobacco: None  . Alcohol Use: 0.0 oz/week    0 Standard drinks or equivalent per week     Comment: occassional   Interim medical history since last visit reviewed. Allergies and medications reviewed  Review of Systems Per HPI unless specifically indicated above     Objective:    BP 118/84 mmHg  Pulse 98  Temp(Src) 98.2 F (36.8 C) (Oral)  Resp 14  Wt 229 lb (103.874 kg)  SpO2 98%  LMP 12/13/2015 (Approximate)  Wt Readings from Last 3 Encounters:  01/04/16 229 lb (103.874 kg)  12/21/15 221 lb (100.245 kg)  08/08/15 220 lb 12.8 oz (100.154 kg)   body mass index is 40.58 kg/(m^2).  Physical Exam  Constitutional: She appears well-developed and well-nourished. No distress.  Weight gain 8 pounds over last 2 weeks; morbidly obese  Eyes: EOM are normal. No scleral icterus.  Neck: No thyromegaly present.  Cardiovascular: Normal rate.   Pulmonary/Chest: Effort normal.  Abdominal: She exhibits no distension.  Skin: No  pallor.  Psychiatric: She has a normal mood and affect. Her behavior is normal. Judgment and thought content normal.   Results for orders placed or performed in visit on 04/05/15  Cytology - PAP  Result Value Ref Range   CYTOLOGY - PAP PAP RESULT       Assessment & Plan:   Problem List Items Addressed This Visit      Other   Morbid obesity with BMI of 40.0-44.9, adult (HCC) - Primary    Supportive listening provided; I hear patient's frustrations; however, I think she still has some dietary modifications that will help (sweet tea, mayo, etc) and can increae her activity level (just walking the dog in the yard is not enough); showed her 7 minute scientific work-out, modify intensity to fit her ability; suggested she borrow a fit bit  or other tracker from a friend for a short time to get a feeling for how much she is moving or not moving; suggested standing desk at work, and she might be able to do that in June; we discussed Qsymia as a next move, but I will want to see her labs and holter monitor before considering that; savings card given though so she'll have it if we move forward with Rx      Tachycardia    Unremarkable echo; still awaiting holter monitor; labs needs to be drawn still to r/o anemia, thyroid dz, electrolyte imbalance         Follow up plan: No Follow-up on file.   An after-visit summary was printed and given to the patient at check-out.  Please see the patient instructions which may contain other information and recommendations beyond what is mentioned above in the assessment and plan.

## 2016-01-06 ENCOUNTER — Encounter: Payer: Self-pay | Admitting: Family Medicine

## 2016-01-06 NOTE — Assessment & Plan Note (Signed)
Supportive listening provided; I hear patient's frustrations; however, I think she still has some dietary modifications that will help (sweet tea, mayo, etc) and can increae her activity level (just walking the dog in the yard is not enough); showed her 7 minute scientific work-out, modify intensity to fit her ability; suggested she borrow a fit bit or other tracker from a friend for a short time to get a feeling for how much she is moving or not moving; suggested standing desk at work, and she might be able to do that in June; we discussed Qsymia as a next move, but I will want to see her labs and holter monitor before considering that; savings card given though so she'll have it if we move forward with Rx

## 2016-01-06 NOTE — Patient Instructions (Signed)
(  computer was down and I could not give her an after visit summary)

## 2016-01-06 NOTE — Assessment & Plan Note (Signed)
Unremarkable echo; still awaiting holter monitor; labs needs to be drawn still to r/o anemia, thyroid dz, electrolyte imbalance

## 2016-01-07 DIAGNOSIS — R000 Tachycardia, unspecified: Secondary | ICD-10-CM | POA: Diagnosis not present

## 2016-01-07 DIAGNOSIS — R5383 Other fatigue: Secondary | ICD-10-CM | POA: Diagnosis not present

## 2016-01-08 ENCOUNTER — Encounter: Payer: Self-pay | Admitting: Family Medicine

## 2016-01-08 LAB — CBC WITH DIFFERENTIAL/PLATELET
BASOS ABS: 0 10*3/uL (ref 0.0–0.2)
Basos: 0 %
EOS (ABSOLUTE): 0.3 10*3/uL (ref 0.0–0.4)
Eos: 4 %
HEMATOCRIT: 40.5 % (ref 34.0–46.6)
HEMOGLOBIN: 12.8 g/dL (ref 11.1–15.9)
IMMATURE GRANS (ABS): 0 10*3/uL (ref 0.0–0.1)
Immature Granulocytes: 0 %
LYMPHS ABS: 2.1 10*3/uL (ref 0.7–3.1)
LYMPHS: 27 %
MCH: 27.2 pg (ref 26.6–33.0)
MCHC: 31.6 g/dL (ref 31.5–35.7)
MCV: 86 fL (ref 79–97)
Monocytes Absolute: 0.7 10*3/uL (ref 0.1–0.9)
Monocytes: 8 %
NEUTROS ABS: 4.9 10*3/uL (ref 1.4–7.0)
Neutrophils: 61 %
Platelets: 267 10*3/uL (ref 150–379)
RBC: 4.7 x10E6/uL (ref 3.77–5.28)
RDW: 13.2 % (ref 12.3–15.4)
WBC: 8.1 10*3/uL (ref 3.4–10.8)

## 2016-01-08 LAB — TSH: TSH: 3.11 u[IU]/mL (ref 0.450–4.500)

## 2016-01-08 LAB — T4, FREE: Free T4: 1.05 ng/dL (ref 0.82–1.77)

## 2016-01-08 LAB — VITAMIN D 25 HYDROXY (VIT D DEFICIENCY, FRACTURES): VIT D 25 HYDROXY: 26.1 ng/mL — AB (ref 30.0–100.0)

## 2016-01-08 LAB — VITAMIN B12: Vitamin B-12: 473 pg/mL (ref 211–946)

## 2016-01-10 MED ORDER — PHENTERMINE-TOPIRAMATE ER 3.75-23 MG PO CP24: 1.0000 | ORAL_CAPSULE | Freq: Every day | ORAL | Status: DC

## 2016-01-14 ENCOUNTER — Telehealth: Payer: Self-pay | Admitting: Family Medicine

## 2016-01-14 NOTE — Telephone Encounter (Signed)
Note received from Dr. Tery SanfilippoGollan Jamie, Please let pt know that the Holter monitor was normal overall The times she had symptoms did not correlate with anything on the heart monitor

## 2016-01-14 NOTE — Telephone Encounter (Signed)
-----   Message from Antonieta Ibaimothy J Gollan, MD sent at 01/11/2016  2:20 PM EDT ----- Regarding: holter I'm having difficulty entering the data on recent Holter monitor on this patient. Working with the hospital to overcome technical issue  Overall the Holter was normal, normal sinus rhythm, only 1 PVC, 1 APC in 48 hours  She did have 2 symptoms detailed in the diary that did not seem to be associated with any arrhythmia  Thanks Dossie Arbourim Gollan,  [facility]

## 2016-01-15 NOTE — Telephone Encounter (Signed)
Left voice mail

## 2016-01-16 NOTE — Telephone Encounter (Signed)
Asher MuirJamie, can you check on the Qsymia and get back to patient about that? Thanks!

## 2016-01-23 ENCOUNTER — Encounter: Payer: Self-pay | Admitting: Cardiovascular Disease

## 2016-02-05 ENCOUNTER — Other Ambulatory Visit: Payer: Self-pay | Admitting: Family Medicine

## 2016-02-05 MED ORDER — PHENTERMINE-TOPIRAMATE ER 7.5-46 MG PO CP24: 1.0000 | ORAL_CAPSULE | Freq: Every day | ORAL | Status: DC

## 2016-02-05 NOTE — Progress Notes (Signed)
New higher dose sent to pharmacy

## 2016-04-09 ENCOUNTER — Encounter: Payer: 59 | Admitting: Obstetrics and Gynecology

## 2016-04-17 ENCOUNTER — Encounter: Payer: Self-pay | Admitting: Obstetrics and Gynecology

## 2016-04-17 ENCOUNTER — Ambulatory Visit (INDEPENDENT_AMBULATORY_CARE_PROVIDER_SITE_OTHER): Payer: 59 | Admitting: Obstetrics and Gynecology

## 2016-04-17 VITALS — BP 110/62 | HR 108 | Ht 63.0 in | Wt 224.9 lb

## 2016-04-17 DIAGNOSIS — Z01419 Encounter for gynecological examination (general) (routine) without abnormal findings: Secondary | ICD-10-CM

## 2016-04-17 DIAGNOSIS — Z30019 Encounter for initial prescription of contraceptives, unspecified: Secondary | ICD-10-CM

## 2016-04-17 MED ORDER — ETONOGESTREL-ETHINYL ESTRADIOL 0.12-0.015 MG/24HR VA RING: VAGINAL_RING | VAGINAL | 12 refills | Status: DC

## 2016-04-17 NOTE — Progress Notes (Signed)
  Subjective:     Veronica Hurst is a 25 y.o. female and is here for a comprehensive physical exam. The patient reports menses are still heavy and lasting longer. occuring monthly and now lasting 7 days with mild cramping. possibly trying for a baby within the next year. would like to lose more weight first..  Social History   Social History  . Marital status: Single    Spouse name: N/A  . Number of children: 0  . Years of education: N/A   Occupational History  . Not on file.   Social History Main Topics  . Smoking status: Never Smoker  . Smokeless tobacco: Never Used  . Alcohol use 0.0 oz/week     Comment: occassional  . Drug use: No  . Sexual activity: Yes    Partners: Male    Birth control/ protection: None   Other Topics Concern  . Not on file   Social History Narrative  . No narrative on file   Health Maintenance  Topic Date Due  . HIV Screening  05/26/2007  . TETANUS/TDAP  05/26/2011  . INFLUENZA VACCINE  03/18/2016  . PAP SMEAR  04/04/2018    The following portions of the patient's history were reviewed and updated as appropriate: allergies, current medications, past family history, past medical history, past social history, past surgical history and problem list.  Review of Systems Pertinent items noted in HPI and remainder of comprehensive ROS otherwise negative.   Objective:    General appearance: alert, cooperative, appears stated age and moderately obese Neck: no adenopathy, no carotid bruit, no JVD, supple, symmetrical, trachea midline and thyroid not enlarged, symmetric, no tenderness/mass/nodules Lungs: clear to auscultation bilaterally Breasts: normal appearance, no masses or tenderness Heart: regular rate and rhythm, S1, S2 normal, no murmur, click, rub or gallop Abdomen: soft, non-tender; bowel sounds normal; no masses,  no organomegaly Pelvic: cervix normal in appearance, external genitalia normal, no adnexal masses or tenderness, no cervical  motion tenderness, rectovaginal septum normal, uterus normal size, shape, and consistency and vagina normal without discharge    Assessment:    Healthy female exam. Menorrhagia, obesity     Plan:  conseled on all HC options and decided on trial of Nuvaring- one placed while here in office and another sample given, will call at end of sample if desires to continue. Desires restart of weigh loss meds- will schedule appointment in next week. RTC 1 year or prn  Ebrahim Deremer, CNM   See After Visit Summary for Counseling Recommendations

## 2016-05-12 ENCOUNTER — Encounter: Payer: Self-pay | Admitting: Family Medicine

## 2016-05-13 MED ORDER — SCOPOLAMINE 1 MG/3DAYS TD PT72
1.0000 | MEDICATED_PATCH | TRANSDERMAL | 0 refills | Status: DC
Start: 2016-05-13 — End: 2018-04-29

## 2016-05-16 ENCOUNTER — Telehealth: Payer: Self-pay | Admitting: Obstetrics and Gynecology

## 2016-05-16 NOTE — Telephone Encounter (Signed)
pls advise

## 2016-05-16 NOTE — Telephone Encounter (Signed)
Pt called and she is on the nuva ring and she is ina wedding this weekend so she is trying to prevent from having her period this weekend so she started having break thru bleeding Wednesday and then Thursday she took it out and put a new one back in and now she is bleeding worse today than yesterday, she wanted to know if this is normal or not, she would like a call back.

## 2016-05-16 NOTE — Telephone Encounter (Signed)
Spoke w/pt. °

## 2016-05-16 NOTE — Telephone Encounter (Signed)
Not necessarily normal, but does happen, unfortunately not much she can do about it but ride it out.

## 2016-05-27 ENCOUNTER — Ambulatory Visit: Payer: 59

## 2016-06-09 ENCOUNTER — Telehealth: Payer: Self-pay | Admitting: Obstetrics and Gynecology

## 2016-06-09 NOTE — Telephone Encounter (Signed)
PT CALLED AND CX HER APPT FOR 10/24 FOR HER WT BP AND B12, SHE HASN'T BEEN TAKING THE MEDICATION LIKE SHE SHOULD DUE TO IT BEING VERY HECTIC, SHE STATED SHE HAS 21 DAYS LEFT ON THE MEDICATION SO I TOLD HER THAT I WOULD SEND THIS TO YOU AND LET YOU KNOW BECAUSE I WASN'T SURE WHEN TO RESCHEDULE THAT FOR HER. LET ME KNOW AND IF YOU NEED TO TALK TO HER AND SHE DOESN'T ANSWER SHE SAID YOU CAN LEAVE A MESSAGE.

## 2016-06-10 ENCOUNTER — Ambulatory Visit: Payer: 59

## 2017-04-21 ENCOUNTER — Ambulatory Visit (INDEPENDENT_AMBULATORY_CARE_PROVIDER_SITE_OTHER): Payer: Managed Care, Other (non HMO) | Admitting: Obstetrics and Gynecology

## 2017-04-21 ENCOUNTER — Other Ambulatory Visit: Payer: Self-pay | Admitting: Obstetrics and Gynecology

## 2017-04-21 ENCOUNTER — Encounter: Payer: Self-pay | Admitting: Obstetrics and Gynecology

## 2017-04-21 VITALS — BP 124/82 | HR 114 | Ht 63.0 in | Wt 216.4 lb

## 2017-04-21 DIAGNOSIS — E669 Obesity, unspecified: Secondary | ICD-10-CM | POA: Diagnosis not present

## 2017-04-21 DIAGNOSIS — N920 Excessive and frequent menstruation with regular cycle: Secondary | ICD-10-CM | POA: Diagnosis not present

## 2017-04-21 DIAGNOSIS — E559 Vitamin D deficiency, unspecified: Secondary | ICD-10-CM

## 2017-04-21 DIAGNOSIS — Z01419 Encounter for gynecological examination (general) (routine) without abnormal findings: Secondary | ICD-10-CM

## 2017-04-21 NOTE — Progress Notes (Signed)
   Subjective:     Veronica Hurst is a married white  26 y.o. female and is here for a comprehensive physical exam. The patient reports problems - feels cold all the time. has multiple family with thyroid problems. .Stopped Nuvaring after using 2 months last year, and has not gotten pregnant yet. Menses monthly, lasts 4-5 days and some clots, mild cramps. 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 Exercising some at home, doesn't smoke or drug use, rare alcohol use.   Social History   Social History  . Marital status: Single    Spouse name: N/A  . Number of children: 0  . Years of education: N/A   Occupational History  . Not on file.   Social History Main Topics  . Smoking status: Never Smoker  . Smokeless tobacco: Never Used  . Alcohol use 0.0 oz/week     Comment: occassional  . Drug use: No  . Sexual activity: Yes    Partners: Male    Birth control/ protection: None   Other Topics Concern  . Not on file   Social History Narrative  . No narrative on file   Health Maintenance  Topic Date Due  . HIV Screening  05/26/2007  . TETANUS/TDAP  05/26/2011  . INFLUENZA VACCINE  03/18/2017  . PAP SMEAR  04/04/2018    The following portions of the patient's history were reviewed and updated as appropriate: allergies, current medications, past family history, past medical history, past social history, past surgical history and problem list.  Review of Systems Pertinent items noted in HPI and remainder of comprehensive ROS otherwise negative.   Objective:    General appearance: alert, cooperative, appears stated age and moderately obese Neck: no adenopathy, no carotid bruit, no JVD, supple, symmetrical, trachea midline and thyroid not enlarged, symmetric, no tenderness/mass/nodules Lungs: clear to auscultation bilaterally Breasts: normal appearance, no masses or tenderness Heart: regular rate and rhythm, S1, S2 normal, no murmur, click, rub or gallop Abdomen: soft, non-tender; bowel  sounds normal; no masses,  no organomegaly Pelvic: cervix normal in appearance, external genitalia normal, no adnexal masses or tenderness, no cervical motion tenderness, rectovaginal septum normal, uterus normal size, shape, and consistency and vagina normal without discharge    Assessment:    Healthy female exam. obesity    heavy menses Plan:  Labs obtained and will follow up accordingly RTC 1 year or as needed.  Melody Aura CampsShambley, CNM   See After Visit Summary for Counseling Recommendations

## 2017-04-21 NOTE — Patient Instructions (Signed)
Preventive Care 18-39 Years, Female Preventive care refers to lifestyle choices and visits with your health care provider that can promote health and wellness. What does preventive care include?  A yearly physical exam. This is also called an annual well check.  Dental exams once or twice a year.  Routine eye exams. Ask your health care provider how often you should have your eyes checked.  Personal lifestyle choices, including: ? Daily care of your teeth and gums. ? Regular physical activity. ? Eating a healthy diet. ? Avoiding tobacco and drug use. ? Limiting alcohol use. ? Practicing safe sex. ? Taking vitamin and mineral supplements as recommended by your health care provider. What happens during an annual well check? The services and screenings done by your health care provider during your annual well check will depend on your age, overall health, lifestyle risk factors, and family history of disease. Counseling Your health care provider may ask you questions about your:  Alcohol use.  Tobacco use.  Drug use.  Emotional well-being.  Home and relationship well-being.  Sexual activity.  Eating habits.  Work and work Statistician.  Method of birth control.  Menstrual cycle.  Pregnancy history.  Screening You may have the following tests or measurements:  Height, weight, and BMI.  Diabetes screening. This is done by checking your blood sugar (glucose) after you have not eaten for a while (fasting).  Blood pressure.  Lipid and cholesterol levels. These may be checked every 5 years starting at age 38.  Skin check.  Hepatitis C blood test.  Hepatitis B blood test.  Sexually transmitted disease (STD) testing.  BRCA-related cancer screening. This may be done if you have a family history of breast, ovarian, tubal, or peritoneal cancers.  Pelvic exam and Pap test. This may be done every 3 years starting at age 38. Starting at age 30, this may be done  every 5 years if you have a Pap test in combination with an HPV test.  Discuss your test results, treatment options, and if necessary, the need for more tests with your health care provider. Vaccines Your health care provider may recommend certain vaccines, such as:  Influenza vaccine. This is recommended every year.  Tetanus, diphtheria, and acellular pertussis (Tdap, Td) vaccine. You may need a Td booster every 10 years.  Varicella vaccine. You may need this if you have not been vaccinated.  HPV vaccine. If you are 39 or younger, you may need three doses over 6 months.  Measles, mumps, and rubella (MMR) vaccine. You may need at least one dose of MMR. You may also need a second dose.  Pneumococcal 13-valent conjugate (PCV13) vaccine. You may need this if you have certain conditions and were not previously vaccinated.  Pneumococcal polysaccharide (PPSV23) vaccine. You may need one or two doses if you smoke cigarettes or if you have certain conditions.  Meningococcal vaccine. One dose is recommended if you are age 68-21 years and a first-year college student living in a residence hall, or if you have one of several medical conditions. You may also need additional booster doses.  Hepatitis A vaccine. You may need this if you have certain conditions or if you travel or work in places where you may be exposed to hepatitis A.  Hepatitis B vaccine. You may need this if you have certain conditions or if you travel or work in places where you may be exposed to hepatitis B.  Haemophilus influenzae type b (Hib) vaccine. You may need this  if you have certain risk factors.  Talk to your health care provider about which screenings and vaccines you need and how often you need them. This information is not intended to replace advice given to you by your health care provider. Make sure you discuss any questions you have with your health care provider. Document Released: 09/30/2001 Document Revised:  04/23/2016 Document Reviewed: 06/05/2015 Elsevier Interactive Patient Education  2017 Elsevier Inc.  

## 2017-04-22 ENCOUNTER — Other Ambulatory Visit: Payer: Managed Care, Other (non HMO)

## 2017-04-23 LAB — COMPREHENSIVE METABOLIC PANEL
ALBUMIN: 4.2 g/dL (ref 3.5–5.5)
ALK PHOS: 71 IU/L (ref 39–117)
ALT: 38 IU/L — ABNORMAL HIGH (ref 0–32)
AST: 26 IU/L (ref 0–40)
Albumin/Globulin Ratio: 1.8 (ref 1.2–2.2)
BUN / CREAT RATIO: 9 (ref 9–23)
BUN: 7 mg/dL (ref 6–20)
Bilirubin Total: 0.3 mg/dL (ref 0.0–1.2)
CO2: 20 mmol/L (ref 20–29)
CREATININE: 0.74 mg/dL (ref 0.57–1.00)
Calcium: 9 mg/dL (ref 8.7–10.2)
Chloride: 97 mmol/L (ref 96–106)
GFR calc non Af Amer: 114 mL/min/{1.73_m2} (ref >59)
GFR, EST AFRICAN AMERICAN: 131 mL/min/{1.73_m2} (ref >59)
GLOBULIN, TOTAL: 2.3 g/dL (ref 1.5–4.5)
Glucose: 81 mg/dL (ref 65–99)
Potassium: 4.4 mmol/L (ref 3.5–5.2)
SODIUM: 137 mmol/L (ref 134–144)
TOTAL PROTEIN: 6.5 g/dL (ref 6.0–8.5)

## 2017-04-23 LAB — B12 AND FOLATE PANEL
FOLATE: 7.9 ng/mL (ref >3.0)
Vitamin B-12: 737 pg/mL (ref 232–1245)

## 2017-04-23 LAB — THYROID PANEL WITH TSH
FREE THYROXINE INDEX: 2 (ref 1.2–4.9)
T3 Uptake Ratio: 28 % (ref 24–39)
T4, Total: 7.2 ug/dL (ref 4.5–12.0)
TSH: 4.22 u[IU]/mL (ref 0.450–4.500)

## 2017-04-23 LAB — LIPID PANEL
CHOLESTEROL TOTAL: 193 mg/dL (ref 100–199)
Chol/HDL Ratio: 2.9 ratio (ref 0.0–4.4)
HDL: 67 mg/dL (ref >39)
LDL Calculated: 105 mg/dL — ABNORMAL HIGH (ref 0–99)
Triglycerides: 103 mg/dL (ref 0–149)
VLDL CHOLESTEROL CAL: 21 mg/dL (ref 5–40)

## 2017-04-23 LAB — CYTOLOGY - PAP

## 2017-04-23 LAB — HEMOGLOBIN A1C
ESTIMATED AVERAGE GLUCOSE: 91 mg/dL
Hgb A1c MFr Bld: 4.8 % (ref 4.8–5.6)

## 2017-04-23 LAB — IRON: IRON: 56 ug/dL (ref 27–159)

## 2017-04-23 LAB — DHEA-SULFATE: DHEA SO4: 62.9 ug/dL — AB (ref 110.0–431.7)

## 2017-04-23 LAB — ESTRADIOL: ESTRADIOL: 49.4 pg/mL

## 2017-04-23 LAB — FSH/LH
FSH: 6.4 m[IU]/mL
LH: 10.7 m[IU]/mL

## 2017-04-23 LAB — PROGESTERONE: Progesterone: <0.1 ng/mL

## 2017-04-23 LAB — VITAMIN D 25 HYDROXY (VIT D DEFICIENCY, FRACTURES): VIT D 25 HYDROXY: 33.1 ng/mL (ref 30.0–100.0)

## 2017-04-27 ENCOUNTER — Encounter: Payer: Self-pay | Admitting: Obstetrics and Gynecology

## 2018-04-29 ENCOUNTER — Encounter: Payer: Self-pay | Admitting: Obstetrics and Gynecology

## 2018-04-29 ENCOUNTER — Ambulatory Visit (INDEPENDENT_AMBULATORY_CARE_PROVIDER_SITE_OTHER): Payer: Managed Care, Other (non HMO) | Admitting: Obstetrics and Gynecology

## 2018-04-29 VITALS — BP 111/70 | HR 93 | Ht 63.0 in | Wt 173.3 lb

## 2018-04-29 DIAGNOSIS — Z01419 Encounter for gynecological examination (general) (routine) without abnormal findings: Secondary | ICD-10-CM | POA: Diagnosis not present

## 2018-04-29 NOTE — Progress Notes (Signed)
  Subjective:     Veronica Hurst is a married white 27 y.o. female and is here for a comprehensive physical exam. The patient reports no problems.last pap 04/2017 was negative. Is not using anything [facility]. Has lost 45#s since last year doing the keto diet. Is working FT at [employer]. Is not currently exercising.   Social History   Socioeconomic History  . Marital status: Single    Spouse name: Not on file  . Number of children: 0  . Years of education: Not on file  . Highest education level: Not on file  Occupational History  . Not on file  Social Needs  . Financial resource strain: Not on file  . Food insecurity:    Worry: Not on file    Inability: Not on file  . Transportation needs:    Medical: Not on file    Non-medical: Not on file  Tobacco Use  . Smoking status: Never Smoker  . Smokeless tobacco: Never Used  Substance and Sexual Activity  . Alcohol use: Yes    Alcohol/week: 0.0 standard drinks    Comment: occassional  . Drug use: No  . Sexual activity: Yes    Partners: Male    Birth control/protection: None  Lifestyle  . Physical activity:    Days per week: Not on file    Minutes per session: Not on file  . Stress: Not on file  Relationships  . Social connections:    Talks on phone: Not on file    Gets together: Not on file    Attends religious service: Not on file    Active member of club or organization: Not on file    Attends meetings of clubs or organizations: Not on file    Relationship status: Not on file  . Intimate partner violence:    Fear of current or ex partner: Not on file    Emotionally abused: Not on file    Physically abused: Not on file    Forced sexual activity: Not on file  Other Topics Concern  . Not on file  Social History Narrative  . Not on file   Health Maintenance  Topic Date Due  . HIV Screening  05/26/2007  . TETANUS/TDAP  05/26/2011  . INFLUENZA VACCINE  03/18/2018  . PAP SMEAR  04/21/2020    The following  portions of the patient's history were reviewed and updated as appropriate: allergies, current medications, past family history, past medical history, past social history, past surgical history and problem list.  Review of Systems A comprehensive review of systems was negative.   Objective:    General appearance: alert, cooperative and appears stated age Neck: no adenopathy, no carotid bruit, no JVD, supple, symmetrical, trachea midline and thyroid not enlarged, symmetric, no tenderness/mass/nodules Lungs: clear to auscultation bilaterally Breasts: normal appearance, no masses or tenderness Heart: regular rate and rhythm, S1, S2 normal, no murmur, click, rub or gallop Abdomen: soft, non-tender; bowel sounds normal; no masses,  no organomegaly Pelvic: cervix normal in appearance, external genitalia normal, no adnexal masses or tenderness, no cervical motion tenderness, rectovaginal septum normal, uterus normal size, shape, and consistency and vagina normal without discharge    Assessment:    Healthy female exam.      Plan:  No labs at this time.congratualted on weight loss.  RTC 1 year or as needed.  Sanora Cunanan,CNM   See After Visit Summary for Counseling Recommendations

## 2018-04-29 NOTE — Patient Instructions (Signed)
Preventive Care 18-39 Years, Female Preventive care refers to lifestyle choices and visits with your health care provider that can promote health and wellness. What does preventive care include?  A yearly physical exam. This is also called an annual well check.  Dental exams once or twice a year.  Routine eye exams. Ask your health care provider how often you should have your eyes checked.  Personal lifestyle choices, including: ? Daily care of your teeth and gums. ? Regular physical activity. ? Eating a healthy diet. ? Avoiding tobacco and drug use. ? Limiting alcohol use. ? Practicing safe sex. ? Taking vitamin and mineral supplements as recommended by your health care provider. What happens during an annual well check? The services and screenings done by your health care provider during your annual well check will depend on your age, overall health, lifestyle risk factors, and family history of disease. Counseling Your health care provider may ask you questions about your:  Alcohol use.  Tobacco use.  Drug use.  Emotional well-being.  Home and relationship well-being.  Sexual activity.  Eating habits.  Work and work Statistician.  Method of birth control.  Menstrual cycle.  Pregnancy history.  Screening You may have the following tests or measurements:  Height, weight, and BMI.  Diabetes screening. This is done by checking your blood sugar (glucose) after you have not eaten for a while (fasting).  Blood pressure.  Lipid and cholesterol levels. These may be checked every 5 years starting at age 38.  Skin check.  Hepatitis C blood test.  Hepatitis B blood test.  Sexually transmitted disease (STD) testing.  BRCA-related cancer screening. This may be done if you have a family history of breast, ovarian, tubal, or peritoneal cancers.  Pelvic exam and Pap test. This may be done every 3 years starting at age 38. Starting at age 30, this may be done  every 5 years if you have a Pap test in combination with an HPV test.  Discuss your test results, treatment options, and if necessary, the need for more tests with your health care provider. Vaccines Your health care provider may recommend certain vaccines, such as:  Influenza vaccine. This is recommended every year.  Tetanus, diphtheria, and acellular pertussis (Tdap, Td) vaccine. You may need a Td booster every 10 years.  Varicella vaccine. You may need this if you have not been vaccinated.  HPV vaccine. If you are 39 or younger, you may need three doses over 6 months.  Measles, mumps, and rubella (MMR) vaccine. You may need at least one dose of MMR. You may also need a second dose.  Pneumococcal 13-valent conjugate (PCV13) vaccine. You may need this if you have certain conditions and were not previously vaccinated.  Pneumococcal polysaccharide (PPSV23) vaccine. You may need one or two doses if you smoke cigarettes or if you have certain conditions.  Meningococcal vaccine. One dose is recommended if you are age 68-21 years and a first-year college student living in a residence hall, or if you have one of several medical conditions. You may also need additional booster doses.  Hepatitis A vaccine. You may need this if you have certain conditions or if you travel or work in places where you may be exposed to hepatitis A.  Hepatitis B vaccine. You may need this if you have certain conditions or if you travel or work in places where you may be exposed to hepatitis B.  Haemophilus influenzae type b (Hib) vaccine. You may need this  if you have certain risk factors.  Talk to your health care provider about which screenings and vaccines you need and how often you need them. This information is not intended to replace advice given to you by your health care provider. Make sure you discuss any questions you have with your health care provider. Document Released: 09/30/2001 Document Revised:  04/23/2016 Document Reviewed: 06/05/2015 Elsevier Interactive Patient Education  2018 Elsevier Inc.  

## 2018-07-23 ENCOUNTER — Ambulatory Visit: Payer: Self-pay

## 2018-12-23 ENCOUNTER — Telehealth: Payer: Self-pay | Admitting: Obstetrics and Gynecology

## 2018-12-23 NOTE — Telephone Encounter (Signed)
The patient called and stated that she would like to speak with Amy in regards to some questions she has about her having a positive preg test and also starting her menstrual cycle today. Please advise.

## 2018-12-23 NOTE — Telephone Encounter (Signed)
Called pt she took pregnancy test last night, only saw one line, threw it in the trash and then saw two lines, advised pt to take another test, if positive call the office for beta lab, since she is having some spotting, pt voiced understanding

## 2019-01-14 ENCOUNTER — Ambulatory Visit: Payer: Self-pay | Admitting: *Deleted

## 2019-03-03 ENCOUNTER — Encounter: Payer: Self-pay | Admitting: Obstetrics and Gynecology

## 2019-03-03 ENCOUNTER — Other Ambulatory Visit: Payer: Self-pay

## 2019-03-03 ENCOUNTER — Encounter: Payer: Self-pay | Admitting: *Deleted

## 2019-03-03 ENCOUNTER — Ambulatory Visit: Payer: Managed Care, Other (non HMO) | Admitting: Obstetrics and Gynecology

## 2019-03-03 VITALS — BP 102/72 | HR 92 | Ht 62.0 in | Wt 186.8 lb

## 2019-03-03 DIAGNOSIS — N926 Irregular menstruation, unspecified: Secondary | ICD-10-CM

## 2019-03-03 LAB — POCT URINE PREGNANCY: Preg Test, Ur: POSITIVE — AB

## 2019-03-03 NOTE — Patient Instructions (Signed)
First Trimester of Pregnancy The first trimester of pregnancy is from week 1 until the end of week 13 (months 1 through 3). A week after a sperm fertilizes an egg, the egg will implant on the wall of the uterus. This embryo will begin to develop into a baby. Genes from you and your partner will form the baby. The female genes will determine whether the baby will be a boy or a girl. At 6-8 weeks, the eyes and face will be formed, and the heartbeat can be seen on ultrasound. At the end of 12 weeks, all the baby's organs will be formed. Now that you are pregnant, you will want to do everything you can to have a healthy baby. Two of the most important things are to get good prenatal care and to follow your health care provider's instructions. Prenatal care is all the medical care you receive before the baby's birth. This care will help prevent, find, and treat any problems during the pregnancy and childbirth. Body changes during your first trimester Your body goes through many changes during pregnancy. The changes vary from woman to woman.  You may gain or lose a couple of pounds at first.  You may feel sick to your stomach (nauseous) and you may throw up (vomit). If the vomiting is uncontrollable, call your health care provider.  You may tire easily.  You may develop headaches that can be relieved by medicines. All medicines should be approved by your health care provider.  You may urinate more often. Painful urination may mean you have a bladder infection.  You may develop heartburn as a result of your pregnancy.  You may develop constipation because certain hormones are causing the muscles that push stool through your intestines to slow down.  You may develop hemorrhoids or swollen veins (varicose veins).  Your breasts may begin to grow larger and become tender. Your nipples may stick out more, and the tissue that surrounds them (areola) may become darker.  Your gums may bleed and may be  sensitive to brushing and flossing.  Dark spots or blotches (chloasma, mask of pregnancy) may develop on your face. This will likely fade after the baby is born.  Your menstrual periods will stop.  You may have a loss of appetite.  You may develop cravings for certain kinds of food.  You may have changes in your emotions from day to day, such as being excited to be pregnant or being concerned that something may go wrong with the pregnancy and baby.  You may have more vivid and strange dreams.  You may have changes in your hair. These can include thickening of your hair, rapid growth, and changes in texture. Some women also have hair loss during or after pregnancy, or hair that feels dry or thin. Your hair will most likely return to normal after your baby is born. What to expect at prenatal visits During a routine prenatal visit:  You will be weighed to make sure you and the baby are growing normally.  Your blood pressure will be taken.  Your abdomen will be measured to track your baby's growth.  The fetal heartbeat will be listened to between weeks 10 and 14 of your pregnancy.  Test results from any previous visits will be discussed. Your health care provider may ask you:  How you are feeling.  If you are feeling the baby move.  If you have had any abnormal symptoms, such as leaking fluid, bleeding, severe headaches, or abdominal  cramping.  If you are using any tobacco products, including cigarettes, chewing tobacco, and electronic cigarettes.  If you have any questions. Other tests that may be performed during your first trimester include:  Blood tests to find your blood type and to check for the presence of any previous infections. The tests will also be used to check for low iron levels (anemia) and protein on red blood cells (Rh antibodies). Depending on your risk factors, or if you previously had diabetes during pregnancy, you may have tests to check for high blood sugar  that affects pregnant women (gestational diabetes).  Urine tests to check for infections, diabetes, or protein in the urine.  An ultrasound to confirm the proper growth and development of the baby.  Fetal screens for spinal cord problems (spina bifida) and Down syndrome.  HIV (human immunodeficiency virus) testing. Routine prenatal testing includes screening for HIV, unless you choose not to have this test.  You may need other tests to make sure you and the baby are doing well. Follow these instructions at home: Medicines  Follow your health care provider's instructions regarding medicine use. Specific medicines may be either safe or unsafe to take during pregnancy.  Take a prenatal vitamin that contains at least 600 micrograms (mcg) of folic acid.  If you develop constipation, try taking a stool softener if your health care provider approves. Eating and drinking   Eat a balanced diet that includes fresh fruits and vegetables, whole grains, good sources of protein such as meat, eggs, or tofu, and low-fat dairy. Your health care provider will help you determine the amount of weight gain that is right for you.  Avoid raw meat and uncooked cheese. These carry germs that can cause birth defects in the baby.  Eating four or five small meals rather than three large meals a day may help relieve nausea and vomiting. If you start to feel nauseous, eating a few soda crackers can be helpful. Drinking liquids between meals, instead of during meals, also seems to help ease nausea and vomiting.  Limit foods that are high in fat and processed sugars, such as fried and sweet foods.  To prevent constipation: ? Eat foods that are high in fiber, such as fresh fruits and vegetables, whole grains, and beans. ? Drink enough fluid to keep your urine clear or pale yellow. Activity  Exercise only as directed by your health care provider. Most women can continue their usual exercise routine during  pregnancy. Try to exercise for 30 minutes at least 5 days a week. Exercising will help you: ? Control your weight. ? Stay in shape. ? Be prepared for labor and delivery.  Experiencing pain or cramping in the lower abdomen or lower back is a good sign that you should stop exercising. Check with your health care provider before continuing with normal exercises.  Try to avoid standing for long periods of time. Move your legs often if you must stand in one place for a long time.  Avoid heavy lifting.  Wear low-heeled shoes and practice good posture.  You may continue to have sex unless your health care provider tells you not to. Relieving pain and discomfort  Wear a good support bra to relieve breast tenderness.  Take warm sitz baths to soothe any pain or discomfort caused by hemorrhoids. Use hemorrhoid cream if your health care provider approves.  Rest with your legs elevated if you have leg cramps or low back pain.  If you develop varicose veins in  your legs, wear support hose. Elevate your feet for 15 minutes, 3-4 times a day. Limit salt in your diet. Prenatal care  Schedule your prenatal visits by the twelfth week of pregnancy. They are usually scheduled monthly at first, then more often in the last 2 months before delivery.  Write down your questions. Take them to your prenatal visits.  Keep all your prenatal visits as told by your health care provider. This is important. Safety  Wear your seat belt at all times when driving.  Make a list of emergency phone numbers, including numbers for family, friends, the hospital, and police and fire departments. General instructions  Ask your health care provider for a referral to a local prenatal education class. Begin classes no later than the beginning of month 6 of your pregnancy.  Ask for help if you have counseling or nutritional needs during pregnancy. Your health care provider can offer advice or refer you to specialists for help  with various needs.  Do not use hot tubs, steam rooms, or saunas.  Do not douche or use tampons or scented sanitary pads.  Do not cross your legs for long periods of time.  Avoid cat litter boxes and soil used by cats. These carry germs that can cause birth defects in the baby and possibly loss of the fetus by miscarriage or stillbirth.  Avoid all smoking, herbs, alcohol, and medicines not prescribed by your health care provider. Chemicals in these products affect the formation and growth of the baby.  Do not use any products that contain nicotine or tobacco, such as cigarettes and e-cigarettes. If you need help quitting, ask your health care provider. You may receive counseling support and other resources to help you quit.  Schedule a dentist appointment. At home, brush your teeth with a soft toothbrush and be gentle when you floss. Contact a health care provider if:  You have dizziness.  You have mild pelvic cramps, pelvic pressure, or nagging pain in the abdominal area.  You have persistent nausea, vomiting, or diarrhea.  You have a bad smelling vaginal discharge.  You have pain when you urinate.  You notice increased swelling in your face, hands, legs, or ankles.  You are exposed to fifth disease or chickenpox.  You are exposed to German measles (rubella) and have never had it. Get help right away if:  You have a fever.  You are leaking fluid from your vagina.  You have spotting or bleeding from your vagina.  You have severe abdominal cramping or pain.  You have rapid weight gain or loss.  You vomit blood or material that looks like coffee grounds.  You develop a severe headache.  You have shortness of breath.  You have any kind of trauma, such as from a fall or a car accident. Summary  The first trimester of pregnancy is from week 1 until the end of week 13 (months 1 through 3).  Your body goes through many changes during pregnancy. The changes vary from  woman to woman.  You will have routine prenatal visits. During those visits, your health care provider will examine you, discuss any test results you may have, and talk with you about how you are feeling. This information is not intended to replace advice given to you by your health care provider. Make sure you discuss any questions you have with your health care provider. Document Released: 07/29/2001 Document Revised: 07/17/2017 Document Reviewed: 07/16/2016 [employer] Patient Education  2020 [employer] Common Medications Safe   in Pregnancy  Acne:      Constipation:  Benzoyl Peroxide     Colace  Clindamycin      Dulcolax Suppository  Topica Erythromycin     Fibercon  Salicylic Acid      Metamucil         Miralax AVOID:        Senakot   Accutane    Cough:  Retin-A       Cough Drops  Tetracycline      Phenergan w/ Codeine if Rx  Minocycline      Robitussin (Plain & DM)  Antibiotics:     Crabs/Lice:  Ceclor       RID  Cephalosporins    AVOID:  E-Mycins      Kwell  Keflex  Macrobid/Macrodantin   Diarrhea:  Penicillin      Kao-Pectate  Zithromax      Imodium AD         PUSH FLUIDS AVOID:       Cipro     Fever:  Tetracycline      Tylenol (Regular or Extra  Minocycline       Strength)  Levaquin      Extra Strength-Do not          Exceed 8 tabs/24 hrs Caffeine:        <200mg/day (equiv. To 1 cup of coffee or  approx. 3 12 oz sodas)         Gas: Cold/Hayfever:       Gas-X  Benadryl      Mylicon  Claritin       Phazyme  **Claritin-D        Chlor-Trimeton    Headaches:  Dimetapp      ASA-Free Excedrin  Drixoral-Non-Drowsy     Cold Compress  Mucinex (Guaifenasin)     Tylenol (Regular or Extra  Sudafed/Sudafed-12 Hour     Strength)  **Sudafed PE Pseudoephedrine   Tylenol Cold & Sinus     Vicks Vapor Rub  Zyrtec  **AVOID if Problems With Blood Pressure         Heartburn: Avoid lying down for at least 1 hour after meals  Aciphex      Maalox     Rash:  Milk of  Magnesia     Benadryl    Mylanta       1% Hydrocortisone Cream  Pepcid  Pepcid Complete   Sleep Aids:  Prevacid      Ambien   Prilosec       Benadryl  Rolaids       Chamomile Tea  Tums (Limit 4/day)     Unisom  Zantac       Tylenol PM         Warm milk-add vanilla or  Hemorrhoids:       Sugar for taste  Anusol/Anusol H.C.  (RX: Analapram 2.5%)  Sugar Substitutes:  Hydrocortisone OTC     Ok in moderation  Preparation H      Tucks        Vaseline lotion applied to tissue with wiping    Herpes:     Throat:  Acyclovir      Oragel  Famvir  Valtrex     Vaccines:         Flu Shot Leg Cramps:       *Gardasil  Benadryl      Hepatitis A         Hepatitis B Nasal Spray:         Pneumovax  Saline Nasal Spray     Polio Booster         Tetanus Nausea:       Tuberculosis test or PPD  Vitamin B6 25 mg TID   AVOID:    Dramamine      *Gardasil  Emetrol       Live Poliovirus  Ginger Root 250 mg QID    MMR (measles, mumps &  High Complex Carbs @ Bedtime    rebella)  Sea Bands-Accupressure    Varicella (Chickenpox)  Unisom 1/2 tab TID     *No known complications           If received before Pain:         Known pregnancy;   Darvocet       Resume series after  Lortab        Delivery  Percocet    Yeast:   Tramadol      Femstat  Tylenol 3      Gyne-lotrimin  Ultram       Monistat  Vicodin           MISC:         All Sunscreens           Hair Coloring/highlights          Insect Repellant's          (Including DEET)         Mystic Tans

## 2019-03-03 NOTE — Progress Notes (Signed)
  Subjective:     Patient ID: Veronica Hurst, female   DOB: [birth year], 28 y.o.   MRN: [id number]  HPI Here for pregnancy confirmation, reporting early & light LMP 01/19/2019, giving EDC 10/26/19 and EGA [redacted]w[redacted]d. Reports heartburn and fatigue as only symptoms.   Review of Systems     Objective:   Physical Exam A&Ox4 Well groomed female in no distress Blood pressure 102/72, pulse 92, height 5\' 2"  (1.575 m), weight 186 lb 12.8 oz (84.7 kg), last menstrual period 01/19/2019. Body mass index is 34.17 kg/m.  UPT+    Assessment:     Missed menses BMI 34    Plan:     Congratulated on pregnancy, discussed routine prenatal care. RTC 2 weeks for viability scan and nurse intake/labs, then 6 weeks for new OB PE with me.    Melody Shambley,CNM

## 2019-03-04 LAB — BETA HCG QUANT (REF LAB): hCG Quant: 12057 m[IU]/mL

## 2019-03-17 ENCOUNTER — Ambulatory Visit (INDEPENDENT_AMBULATORY_CARE_PROVIDER_SITE_OTHER): Payer: Managed Care, Other (non HMO)

## 2019-03-17 ENCOUNTER — Other Ambulatory Visit: Payer: Self-pay

## 2019-03-17 ENCOUNTER — Ambulatory Visit (INDEPENDENT_AMBULATORY_CARE_PROVIDER_SITE_OTHER): Payer: Managed Care, Other (non HMO) | Admitting: Obstetrics and Gynecology

## 2019-03-17 VITALS — BP 104/73 | HR 88 | Ht 62.0 in | Wt 192.7 lb

## 2019-03-17 DIAGNOSIS — Z3401 Encounter for supervision of normal first pregnancy, first trimester: Secondary | ICD-10-CM

## 2019-03-17 DIAGNOSIS — Z3687 Encounter for antenatal screening for uncertain dates: Secondary | ICD-10-CM | POA: Diagnosis not present

## 2019-03-17 DIAGNOSIS — Z6835 Body mass index (BMI) 35.0-35.9, adult: Secondary | ICD-10-CM

## 2019-03-17 DIAGNOSIS — Z113 Encounter for screening for infections with a predominantly sexual mode of transmission: Secondary | ICD-10-CM

## 2019-03-17 DIAGNOSIS — Z0283 Encounter for blood-alcohol and blood-drug test: Secondary | ICD-10-CM

## 2019-03-17 DIAGNOSIS — N926 Irregular menstruation, unspecified: Secondary | ICD-10-CM

## 2019-03-17 NOTE — Patient Instructions (Signed)
First Trimester of Pregnancy °The first trimester of pregnancy is from week 1 until the end of week 13 (months 1 through 3). A week after a sperm fertilizes an egg, the egg will implant on the wall of the uterus. This embryo will begin to develop into a baby. Genes from you and your partner will form the baby. The female genes will determine whether the baby will be a boy or a girl. At 6-8 weeks, the eyes and face will be formed, and the heartbeat can be seen on ultrasound. At the end of 12 weeks, all the baby's organs will be formed. °Now that you are pregnant, you will want to do everything you can to have a healthy baby. Two of the most important things are to get good prenatal care and to follow your health care provider's instructions. Prenatal care is all the medical care you receive before the baby's birth. This care will help prevent, find, and treat any problems during the pregnancy and childbirth. °Body changes during your first trimester °Your body goes through many changes during pregnancy. The changes vary from woman to woman. °· You may gain or lose a couple of pounds at first. °· You may feel sick to your stomach (nauseous) and you may throw up (vomit). If the vomiting is uncontrollable, call your health care provider. °· You may tire easily. °· You may develop headaches that can be relieved by medicines. All medicines should be approved by your health care provider. °· You may urinate more often. Painful urination may mean you have a bladder infection. °· You may develop heartburn as a result of your pregnancy. °· You may develop constipation because certain hormones are causing the muscles that push stool through your intestines to slow down. °· You may develop hemorrhoids or swollen veins (varicose veins). °· Your breasts may begin to grow larger and become tender. Your nipples may stick out more, and the tissue that surrounds them (areola) may become darker. °· Your gums may bleed and may be  sensitive to brushing and flossing. °· Dark spots or blotches (chloasma, mask of pregnancy) may develop on your face. This will likely fade after the baby is born. °· Your menstrual periods will stop. °· You may have a loss of appetite. °· You may develop cravings for certain kinds of food. °· You may have changes in your emotions from day to day, such as being excited to be pregnant or being concerned that something may go wrong with the pregnancy and baby. °· You may have more vivid and strange dreams. °· You may have changes in your hair. These can include thickening of your hair, rapid growth, and changes in texture. Some women also have hair loss during or after pregnancy, or hair that feels dry or thin. Your hair will most likely return to normal after your baby is born. °What to expect at prenatal visits °During a routine prenatal visit: °· You will be weighed to make sure you and the baby are growing normally. °· Your blood pressure will be taken. °· Your abdomen will be measured to track your baby's growth. °· The fetal heartbeat will be listened to between weeks 10 and 14 of your pregnancy. °· Test results from any previous visits will be discussed. °Your health care provider may ask you: °· How you are feeling. °· If you are feeling the baby move. °· If you have had any abnormal symptoms, such as leaking fluid, bleeding, severe headaches, or abdominal   cramping. °· If you are using any tobacco products, including cigarettes, chewing tobacco, and electronic cigarettes. °· If you have any questions. °Other tests that may be performed during your first trimester include: °· Blood tests to find your blood type and to check for the presence of any previous infections. The tests will also be used to check for low iron levels (anemia) and protein on red blood cells (Rh antibodies). Depending on your risk factors, or if you previously had diabetes during pregnancy, you may have tests to check for high blood sugar  that affects pregnant women (gestational diabetes). °· Urine tests to check for infections, diabetes, or protein in the urine. °· An ultrasound to confirm the proper growth and development of the baby. °· Fetal screens for spinal cord problems (spina bifida) and Down syndrome. °· HIV (human immunodeficiency virus) testing. Routine prenatal testing includes screening for HIV, unless you choose not to have this test. °· You may need other tests to make sure you and the baby are doing well. °Follow these instructions at home: °Medicines °· Follow your health care provider's instructions regarding medicine use. Specific medicines may be either safe or unsafe to take during pregnancy. °· Take a prenatal vitamin that contains at least 600 micrograms (mcg) of folic acid. °· If you develop constipation, try taking a stool softener if your health care provider approves. °Eating and drinking ° °· Eat a balanced diet that includes fresh fruits and vegetables, whole grains, good sources of protein such as meat, eggs, or tofu, and low-fat dairy. Your health care provider will help you determine the amount of weight gain that is right for you. °· Avoid raw meat and uncooked cheese. These carry germs that can cause birth defects in the baby. °· Eating four or five small meals rather than three large meals a day may help relieve nausea and vomiting. If you start to feel nauseous, eating a few soda crackers can be helpful. Drinking liquids between meals, instead of during meals, also seems to help ease nausea and vomiting. °· Limit foods that are high in fat and processed sugars, such as fried and sweet foods. °· To prevent constipation: °? Eat foods that are high in fiber, such as fresh fruits and vegetables, whole grains, and beans. °? Drink enough fluid to keep your urine clear or pale yellow. °Activity °· Exercise only as directed by your health care provider. Most women can continue their usual exercise routine during  pregnancy. Try to exercise for 30 minutes at least 5 days a week. Exercising will help you: °? Control your weight. °? Stay in shape. °? Be prepared for labor and delivery. °· Experiencing pain or cramping in the lower abdomen or lower back is a good sign that you should stop exercising. Check with your health care provider before continuing with normal exercises. °· Try to avoid standing for long periods of time. Move your legs often if you must stand in one place for a long time. °· Avoid heavy lifting. °· Wear low-heeled shoes and practice good posture. °· You may continue to have sex unless your health care provider tells you not to. °Relieving pain and discomfort °· Wear a good support bra to relieve breast tenderness. °· Take warm sitz baths to soothe any pain or discomfort caused by hemorrhoids. Use hemorrhoid cream if your health care provider approves. °· Rest with your legs elevated if you have leg cramps or low back pain. °· If you develop varicose veins in   your legs, wear support hose. Elevate your feet for 15 minutes, 3-4 times a day. Limit salt in your diet. °Prenatal care °· Schedule your prenatal visits by the twelfth week of pregnancy. They are usually scheduled monthly at first, then more often in the last 2 months before delivery. °· Write down your questions. Take them to your prenatal visits. °· Keep all your prenatal visits as told by your health care provider. This is important. °Safety °· Wear your seat belt at all times when driving. °· Make a list of emergency phone numbers, including numbers for family, friends, the hospital, and police and fire departments. °General instructions °· Ask your health care provider for a referral to a local prenatal education class. Begin classes no later than the beginning of month 6 of your pregnancy. °· Ask for help if you have counseling or nutritional needs during pregnancy. Your health care provider can offer advice or refer you to specialists for help  with various needs. °· Do not use hot tubs, steam rooms, or saunas. °· Do not douche or use tampons or scented sanitary pads. °· Do not cross your legs for long periods of time. °· Avoid cat litter boxes and soil used by cats. These carry germs that can cause birth defects in the baby and possibly loss of the fetus by miscarriage or stillbirth. °· Avoid all smoking, herbs, alcohol, and medicines not prescribed by your health care provider. Chemicals in these products affect the formation and growth of the baby. °· Do not use any products that contain nicotine or tobacco, such as cigarettes and e-cigarettes. If you need help quitting, ask your health care provider. You may receive counseling support and other resources to help you quit. °· Schedule a dentist appointment. At home, brush your teeth with a soft toothbrush and be gentle when you floss. °Contact a health care provider if: °· You have dizziness. °· You have mild pelvic cramps, pelvic pressure, or nagging pain in the abdominal area. °· You have persistent nausea, vomiting, or diarrhea. °· You have a bad smelling vaginal discharge. °· You have pain when you urinate. °· You notice increased swelling in your face, hands, legs, or ankles. °· You are exposed to fifth disease or chickenpox. °· You are exposed to German measles (rubella) and have never had it. °Get help right away if: °· You have a fever. °· You are leaking fluid from your vagina. °· You have spotting or bleeding from your vagina. °· You have severe abdominal cramping or pain. °· You have rapid weight gain or loss. °· You vomit blood or material that looks like coffee grounds. °· You develop a severe headache. °· You have shortness of breath. °· You have any kind of trauma, such as from a fall or a car accident. °Summary °· The first trimester of pregnancy is from week 1 until the end of week 13 (months 1 through 3). °· Your body goes through many changes during pregnancy. The changes vary from  woman to woman. °· You will have routine prenatal visits. During those visits, your health care provider will examine you, discuss any test results you may have, and talk with you about how you are feeling. °This information is not intended to replace advice given to you by your health care provider. Make sure you discuss any questions you have with your health care provider. °Document Released: 07/29/2001 Document Revised: 07/17/2017 Document Reviewed: 07/16/2016 °Elsevier Patient Education © 2020 [employer] ° °Commonly Asked   Questions During Pregnancy ° °Cats: A parasite can be excreted in cat feces.  To avoid exposure you need to have another person empty the little box.  If you must empty the litter box you will need to wear gloves.  Wash your hands after handling your cat.  This parasite can also be found in raw or undercooked meat so this should also be avoided. ° °Colds, Sore Throats, Flu: Please check your medication sheet to see what you can take for symptoms.  If your symptoms are unrelieved by these medications please call the office. ° °Dental Work: Most any dental work your dentist recommends is permitted.  X-rays should only be taken during the first trimester if absolutely necessary.  Your abdomen should be shielded with a lead apron during all x-rays.  Please notify your provider prior to receiving any x-rays.  Novocaine is fine; gas is not recommended.  If your dentist requires a note from us prior to dental work please call the office and we will provide one for you. ° °Exercise: Exercise is an important part of staying healthy during your pregnancy.  You may continue most exercises you were accustomed to prior to pregnancy.  Later in your pregnancy you will most likely notice you have difficulty with activities requiring balance like riding a bicycle.  It is important that you listen to your body and avoid activities that put you at a higher risk of falling.  Adequate rest and staying well  hydrated are a must!  If you have questions about the safety of specific activities ask your provider.   ° °Exposure to Children with illness: Try to avoid obvious exposure; report any symptoms to us when noted,  If you have chicken pos, red measles or mumps, you should be immune to these diseases.   Please do not take any vaccines while pregnant unless you have checked with your OB provider. ° °Fetal Movement: After 28 weeks we recommend you do "kick counts" twice daily.  Lie or sit down in a calm quiet environment and count your baby movements "kicks".  You should feel your baby at least 10 times per hour.  If you have not felt 10 kicks within the first hour get up, walk around and have something sweet to eat or drink then repeat for an additional hour.  If count remains less than 10 per hour notify your provider. ° °Fumigating: Follow your pest control agent's advice as to how long to stay out of your home.  Ventilate the area well before re-entering. ° °Hemorrhoids:   Most over-the-counter preparations can be used during pregnancy.  Check your medication to see what is safe to use.  It is important to use a stool softener or fiber in your diet and to drink lots of liquids.  If hemorrhoids seem to be getting worse please call the office.  ° °Hot Tubs:  Hot tubs Jacuzzis and saunas are not recommended while pregnant.  These increase your internal body temperature and should be avoided. ° °Intercourse:  Sexual intercourse is safe during pregnancy as long as you are comfortable, unless otherwise advised by your provider.  Spotting may occur after intercourse; report any bright red bleeding that is heavier than spotting. ° °Labor:  If you know that you are in labor, please go to the hospital.  If you are unsure, please call the office and let us help you decide what to do. ° °Lifting, straining, etc:  If your job requires heavy lifting or   straining please check with your provider for any limitations.  Generally, you  should not lift items heavier than that you can lift simply with your hands and arms (no back muscles) ° °Painting:  Paint fumes do not harm your pregnancy, but may make you ill and should be avoided if possible.  Latex or water based paints have less odor than oils.  Use adequate ventilation while painting. ° °Permanents & Hair Color:  Chemicals in hair dyes are not recommended as they cause increase hair dryness which can increase hair loss during pregnancy.  " Highlighting" and permanents are allowed.  Dye may be absorbed differently and permanents may not hold as well during pregnancy. ° °Sunbathing:  Use a sunscreen, as skin burns easily during pregnancy.  Drink plenty of fluids; avoid over heating. ° °Tanning Beds:  Because their possible side effects are still unknown, tanning beds are not recommended. ° °Ultrasound Scans:  Routine ultrasounds are performed at approximately 20 weeks.  You will be able to see your baby's general anatomy an if you would like to know the gender this can usually be determined as well.  If it is questionable when you conceived you may also receive an ultrasound early in your pregnancy for dating purposes.  Otherwise ultrasound exams are not routinely performed unless there is a medical necessity.  Although you can request a scan we ask that you pay for it when conducted because insurance does not cover " patient request" scans. ° °Work: If your pregnancy proceeds without complications you may work until your due date, unless your physician or employer advises otherwise. ° °Round Ligament Pain/Pelvic Discomfort:  Sharp, shooting pains not associated with bleeding are fairly common, usually occurring in the second trimester of pregnancy.  They tend to be worse when standing up or when you remain standing for long periods of time.  These are the result of pressure of certain pelvic ligaments called "round ligaments".  Rest, Tylenol and heat seem to be the most effective relief.  As  the womb and fetus grow, they rise out of the pelvis and the discomfort improves.  Please notify the office if your pain seems different than that described.  It may represent a more serious condition. ° ° °How a Baby Grows During Pregnancy ° °Pregnancy begins when a female's sperm enters a female's egg (fertilization). Fertilization usually happens in one of the tubes (fallopian tubes) that connect the ovaries to the womb (uterus). The fertilized egg moves down the fallopian tube to the uterus. Once it reaches the uterus, it implants into the lining of the uterus and begins to grow. °For the first 10 weeks, the fertilized egg is called an embryo. After 10 weeks, it is called a fetus. As the fetus continues to grow, it receives oxygen and nutrients through tissue (placenta) that grows to support the developing baby. The placenta is the life support system for the baby. It provides oxygen and nutrition and removes waste. °Learning as much as you can about your pregnancy and how your baby is developing can help you enjoy the experience. It can also make you aware of when there might be a problem and when to ask questions. °How long does a typical pregnancy last? °A pregnancy usually lasts 280 days, or about 40 weeks. Pregnancy is divided into three periods of growth, also called trimesters: °· First trimester: 0-12 weeks. °· Second trimester: 13-27 weeks. °· Third trimester: 28-40 weeks. °The day when your baby is ready to be   born (full term) is your estimated date of delivery. °How does my baby develop month by month? °First month °· The fertilized egg attaches to the inside of the uterus. °· Some cells will form the placenta. Others will form the fetus. °· The arms, legs, brain, spinal cord, lungs, and heart begin to develop. °· At the end of the first month, the heart begins to beat. °Second month °· The bones, inner ear, eyelids, hands, and feet form. °· The genitals develop. °· By the end of 8 weeks, all major  organs are developing. °Third month °· All of the internal organs are forming. °· Teeth develop below the gums. °· Bones and muscles begin to grow. The spine can flex. °· The skin is transparent. °· Fingernails and toenails begin to form. °· Arms and legs continue to grow longer, and hands and feet develop. °· The fetus is about 3 inches (7.6 cm) long. °Fourth month °· The placenta is completely formed. °· The external sex organs, neck, outer ear, eyebrows, eyelids, and fingernails are formed. °· The fetus can hear, swallow, and move its arms and legs. °· The kidneys begin to produce urine. °· The skin is covered with a white, waxy coating (vernix) and very fine hair (lanugo). °Fifth month °· The fetus moves around more and can be felt for the first time (quickening). °· The fetus starts to sleep and wake up and may begin to suck its finger. °· The nails grow to the end of the fingers. °· The organ in the digestive system that makes bile (gallbladder) functions and helps to digest nutrients. °· If your baby is a girl, eggs are present in her ovaries. If your baby is a boy, testicles start to move down into his scrotum. °Sixth month °· The lungs are formed. °· The eyes open. The brain continues to develop. °· Your baby has fingerprints and toe prints. Your baby's hair grows thicker. °· At the end of the second trimester, the fetus is about 9 inches (22.9 cm) long. °Seventh month °· The fetus kicks and stretches. °· The eyes are developed enough to sense changes in light. °· The hands can make a grasping motion. °· The fetus responds to sound. °Eighth month °· All organs and body systems are fully developed and functioning. °· Bones harden, and taste buds develop. The fetus may hiccup. °· Certain areas of the brain are still developing. The skull remains soft. °Ninth month °· The fetus gains about ½ lb (0.23 kg) each week. °· The lungs are fully developed. °· Patterns of sleep develop. °· The fetus's head typically  moves into a head-down position (vertex) in the uterus to prepare for birth. °· The fetus weighs 6-9 lb (2.72-4.08 kg) and is 19-20 inches (48.26-50.8 cm) long. °What can I do to have a healthy pregnancy and help my baby develop? °General instructions °· Take prenatal vitamins as directed by your health care provider. These include vitamins such as folic acid, iron, calcium, and vitamin D. They are important for healthy development. °· Take medicines only as directed by your health care provider. Read labels and ask a pharmacist or your health care provider whether over-the-counter medicines, supplements, and prescription drugs are safe to take during pregnancy. °· Keep all follow-up visits as directed by your health care provider. This is important. Follow-up visits include prenatal care and screening tests. °How do I know if my baby is developing well? °At each prenatal visit, your health care provider will do   several different tests to check on your health and keep track of your baby's development. These include: °· Fundal height and position. °? Your health care provider will measure your growing belly from your pubic bone to the top of the uterus using a tape measure. °? Your health care provider will also feel your belly to determine your baby's position. °· Heartbeat. °? An ultrasound in the first trimester can confirm pregnancy and show a heartbeat, depending on how far along you are. °? Your health care provider will check your baby's heart rate at every prenatal visit. °· Second trimester ultrasound. °? This ultrasound checks your baby's development. It also may show your baby's gender. °What should I do if I have concerns about my baby's development? °Always talk with your health care provider about any concerns that you may have about your pregnancy and your baby. °Summary °· A pregnancy usually lasts 280 days, or about 40 weeks. Pregnancy is divided into three periods of growth, also called  trimesters. °· Your health care provider will monitor your baby's growth and development throughout your pregnancy. °· Follow your health care provider's recommendations about taking prenatal vitamins and medicines during your pregnancy. °· Talk with your health care provider if you have any concerns about your pregnancy or your developing baby. °This information is not intended to replace advice given to you by your health care provider. Make sure you discuss any questions you have with your health care provider. °Document Released: 01/21/2008 Document Revised: 11/25/2018 Document Reviewed: 06/17/2017 °Elsevier Patient Education © 2020 [employer] °  °

## 2019-03-17 NOTE — Progress Notes (Signed)
Dorthy Cooler presents for NOB nurse interview visit.  Pregnancy confirmation done at [facility].  G-1.  P-0.  LMP 01/19/2019.  EDD 10/26/2019. Dating scan done 03/17/2019 [facility] [redacted]w[redacted]d.  Pregnancy education material explained and given.  No cats in the home .  NOB labs ordered.  TSH/HgA1C ordered due to increased BMI.  Body mass index is 35.25 kg/m.  Sickle cell not ordered. HIV and drug screen were explained and ordered.  PNV encouraged.  Genetic screening options discussed.  Genetic testing: Unsure.  FMLA form signed. Financial policy reviewed.  Patient to [facility] in 4 weeks, has NOB physical scheduled already with [facility].

## 2019-03-18 LAB — URINALYSIS, ROUTINE W REFLEX MICROSCOPIC
Bilirubin, UA: NEGATIVE
Glucose, UA: NEGATIVE
Ketones, UA: NEGATIVE
Leukocytes,UA: NEGATIVE
Nitrite, UA: NEGATIVE
Protein,UA: NEGATIVE
RBC, UA: NEGATIVE
Specific Gravity, UA: 1.005 (ref 1.005–1.030)
Urobilinogen, Ur: 0.2 mg/dL (ref 0.2–1.0)
pH, UA: 6.5 (ref 5.0–7.5)

## 2019-03-18 LAB — RPR: RPR Ser Ql: NONREACTIVE

## 2019-03-18 LAB — ABO AND RH: Rh Factor: POSITIVE

## 2019-03-18 LAB — TSH: TSH: 3.71 u[IU]/mL (ref 0.450–4.500)

## 2019-03-18 LAB — HEPATITIS B SURFACE ANTIGEN: Hepatitis B Surface Ag: NEGATIVE

## 2019-03-18 LAB — RUBELLA SCREEN: Rubella Antibodies, IGG: 4.64 index (ref >0.99)

## 2019-03-18 LAB — HGB SOLU + RFLX FRAC: Sickle Solubility Test - HGBRFX: NEGATIVE

## 2019-03-18 LAB — HEMOGLOBIN A1C
Est. average glucose Bld gHb Est-mCnc: 88 mg/dL
Hgb A1c MFr Bld: 4.7 % — ABNORMAL LOW (ref 4.8–5.6)

## 2019-03-18 LAB — ANTIBODY SCREEN: Antibody Screen: NEGATIVE

## 2019-03-18 LAB — VARICELLA ZOSTER ANTIBODY, IGG: Varicella zoster IgG: >4000 index (ref >165)

## 2019-03-18 LAB — HIV ANTIBODY (ROUTINE TESTING W REFLEX): HIV Screen 4th Generation wRfx: NONREACTIVE

## 2019-03-19 LAB — NICOTINE SCREEN, URINE: Cotinine Ql Scrn, Ur: NEGATIVE ng/mL

## 2019-03-19 LAB — MONITOR DRUG PROFILE 14(MW)
Amphetamine Scrn, Ur: NEGATIVE ng/mL
BARBITURATE SCREEN URINE: NEGATIVE ng/mL
BENZODIAZEPINE SCREEN, URINE: NEGATIVE ng/mL
Buprenorphine, Urine: NEGATIVE ng/mL
CANNABINOIDS UR QL SCN: NEGATIVE ng/mL
Cocaine (Metab) Scrn, Ur: NEGATIVE ng/mL
Creatinine(Crt), U: 10.1 mg/dL — ABNORMAL LOW (ref 20.0–300.0)
Fentanyl, Urine: NEGATIVE pg/mL
Meperidine Screen, Urine: NEGATIVE ng/mL
Methadone Screen, Urine: NEGATIVE ng/mL
OXYCODONE+OXYMORPHONE UR QL SCN: NEGATIVE ng/mL
Opiate Scrn, Ur: NEGATIVE ng/mL
Ph of Urine: 6.1 (ref 4.5–8.9)
Phencyclidine Qn, Ur: NEGATIVE ng/mL
Propoxyphene Scrn, Ur: NEGATIVE ng/mL
SPECIFIC GRAVITY: 1.0022
Tramadol Screen, Urine: NEGATIVE ng/mL

## 2019-03-19 LAB — URINE CULTURE, OB REFLEX

## 2019-03-19 LAB — CULTURE, OB URINE

## 2019-03-23 LAB — GC/CHLAMYDIA PROBE AMP
Chlamydia trachomatis, NAA: NEGATIVE
Neisseria Gonorrhoeae by PCR: NEGATIVE

## 2019-04-08 ENCOUNTER — Ambulatory Visit: Payer: Self-pay

## 2019-04-15 ENCOUNTER — Encounter: Payer: Managed Care, Other (non HMO) | Admitting: Obstetrics and Gynecology

## 2019-04-20 ENCOUNTER — Ambulatory Visit (INDEPENDENT_AMBULATORY_CARE_PROVIDER_SITE_OTHER): Payer: Managed Care, Other (non HMO) | Admitting: Certified Nurse Midwife

## 2019-04-20 ENCOUNTER — Other Ambulatory Visit: Payer: Self-pay

## 2019-04-20 ENCOUNTER — Encounter: Payer: Self-pay | Admitting: Obstetrics and Gynecology

## 2019-04-20 ENCOUNTER — Encounter: Payer: Self-pay | Admitting: Certified Nurse Midwife

## 2019-04-20 ENCOUNTER — Other Ambulatory Visit (HOSPITAL_COMMUNITY)
Admission: RE | Admit: 2019-04-20 | Discharge: 2019-04-20 | Disposition: A | Discharge status: Home / Self Care | Payer: Managed Care, Other (non HMO) | Source: Ambulatory Visit | Attending: Obstetrics and Gynecology | Admitting: Obstetrics and Gynecology

## 2019-04-20 VITALS — BP 117/75 | HR 97 | Wt 201.7 lb

## 2019-04-20 DIAGNOSIS — Z3402 Encounter for supervision of normal first pregnancy, second trimester: Secondary | ICD-10-CM | POA: Insufficient documentation

## 2019-04-20 DIAGNOSIS — E669 Obesity, unspecified: Secondary | ICD-10-CM | POA: Insufficient documentation

## 2019-04-20 DIAGNOSIS — O99211 Obesity complicating pregnancy, first trimester: Secondary | ICD-10-CM

## 2019-04-20 DIAGNOSIS — O9989 Other specified diseases and conditions complicating pregnancy, childbirth and the puerperium: Secondary | ICD-10-CM

## 2019-04-20 DIAGNOSIS — Z3A13 13 weeks gestation of pregnancy: Secondary | ICD-10-CM

## 2019-04-20 DIAGNOSIS — N898 Other specified noninflammatory disorders of vagina: Secondary | ICD-10-CM

## 2019-04-20 DIAGNOSIS — Z3492 Encounter for supervision of normal pregnancy, unspecified, second trimester: Secondary | ICD-10-CM

## 2019-04-20 LAB — POCT URINALYSIS DIPSTICK OB
Bilirubin, UA: NEGATIVE
Blood, UA: NEGATIVE
Glucose, UA: NEGATIVE
Ketones, UA: NEGATIVE
Leukocytes, UA: NEGATIVE
Nitrite, UA: NEGATIVE
POC,PROTEIN,UA: NEGATIVE
Spec Grav, UA: 1.01 (ref 1.010–1.025)
Urobilinogen, UA: 0.2 E.U./dL
pH, UA: 6.5 (ref 5.0–8.0)

## 2019-04-20 NOTE — Patient Instructions (Signed)

## 2019-04-20 NOTE — Progress Notes (Signed)
NEW OB HISTORY AND PHYSICAL  SUBJECTIVE:       Veronica Hurst is a 28 y.o. [id number] female, Patient's last menstrual period was 01/19/2019 (exact date)., Estimated Date of Delivery: 10/26/19, [id number], presents today for establishment of Prenatal Care. She has no unusual complaints.     Gynecologic History Patient's last menstrual period was 01/19/2019 (exact date). Normal Contraception: none Last Pap: 04/21/2017. Results were: normal  Obstetric History OB History  Gravida Para Term Preterm AB Living  1 0 0 0 0 0  SAB TAB Ectopic Multiple Live Births  0 0 0 0 0    # Outcome Date GA Lbr Len/2nd Weight Sex Delivery Anes PTL Lv  1 Current             Past Medical History:  Diagnosis Date  . Allergy   . Anxiety   . Hyperlipidemia LDL goal <100 03/06/2015  . Obesity (BMI 30-39.9)   . Tachycardia 12/21/2015    Past Surgical History:  Procedure Laterality Date  . INCISION AND DRAINAGE ABSCESS N/A    abdomen  . TONSILLECTOMY      Current Outpatient Medications on File Prior to Visit  Medication Sig Dispense Refill  . diphenhydramine-acetaminophen (TYLENOL PM) 25-500 MG TABS tablet Take 1 tablet by mouth at bedtime as needed.    . Prenatal Vit-Fe Fumarate-FA (MULTIVITAMIN-PRENATAL) 27-0.8 MG TABS tablet Take 1 tablet by mouth daily at 12 noon.     No current facility-administered medications on file prior to visit.     No Known Allergies  Social History   Socioeconomic History  . Marital status: Single    Spouse name: Not on file  . Number of children: 0  . Years of education: Not on file  . Highest education level: Not on file  Occupational History  . Not on file  Social Needs  . Financial resource strain: Not on file  . Food insecurity    Worry: Not on file    Inability: Not on file  . Transportation needs    Medical: Not on file    Non-medical: Not on file  Tobacco Use  . Smoking status: Never Smoker  . Smokeless tobacco: Never Used  Substance and Sexual  Activity  . Alcohol use: Yes    Alcohol/week: 0.0 standard drinks    Comment: occassional  . Drug use: No  . Sexual activity: Yes    Partners: Male    Birth control/protection: None  Lifestyle  . Physical activity    Days per week: Not on file    Minutes per session: Not on file  . Stress: Not on file  Relationships  . Social Musicianconnections    Talks on phone: Not on file    Gets together: Not on file    Attends religious service: Not on file    Active member of club or organization: Not on file    Attends meetings of clubs or organizations: Not on file    Relationship status: Not on file  . Intimate partner violence    Fear of current or ex partner: Not on file    Emotionally abused: Not on file    Physically abused: Not on file    Forced sexual activity: Not on file  Other Topics Concern  . Not on file  Social History Narrative  . Not on file    Family History  Problem Relation Age of Onset  . Hyperlipidemia Mother   . Hypertension Father   . Heart disease  Father        on dad's side  . Thyroid disease Father   . Cancer Maternal Grandfather   . Thyroid disease Sister   . Breast cancer Maternal Aunt   . Thyroid disease Maternal Aunt     The following portions of the patient's history were reviewed and updated as appropriate: allergies, current medications, past OB history, past medical history, past surgical history, past family history, past social history, and problem list.    OBJECTIVE: Initial Physical Exam (New OB)  GENERAL APPEARANCE: alert, well appearing,over weight in no apparent distress, oriented to person, place and time, overweight HEAD: normocephalic, atraumatic MOUTH: mucous membranes moist, pharynx normal without lesions THYROID: no thyromegaly or masses present BREASTS: no masses noted, no significant tenderness, no palpable axillary nodes, no skin changes LUNGS: clear to auscultation, no wheezes, rales or rhonchi, symmetric air entry HEART:  regular rate and rhythm, no murmurs ABDOMEN: soft, nontender, nondistended, no abnormal masses, no epigastric pain, obese, fundus soft, nontender 13 weeks size and FHT present EXTREMITIES: no redness or tenderness in the calves or thighs, no edema, no limitation in range of motion, intact peripheral pulses SKIN: normal coloration and turgor, no rashes LYMPH NODES: no adenopathy palpable NEUROLOGIC: alert, oriented, normal speech, no focal findings or movement disorder noted  PELVIC EXAM EXTERNAL GENITALIA: normal appearing vulva with no masses, tenderness or lesions VAGINA: no abnormal discharge or lesions and pt complain of odor ( swab collected)  CERVIX: no lesions or cervical motion tenderness UTERUS: gravid ADNEXA: no masses palpable and nontender OB EXAM PELVIMETRY: appears adequate RECTUM: exam not indicated  ASSESSMENT: Normal pregnancy  PLAN: sNew OB counseling: The patient has been given an overview regarding routine prenatal care. Recommendations regarding diet, weight gain, and exercise in pregnancy were given. Prenatal testing, optional genetic testing, carrier screening and ultrasound use in pregnancy were reviewed. Panorama testing today.  Benefits of Breast Feeding were discussed. The patient is encouraged to consider nursing her baby post partum.  Philip Aspen, CNM

## 2019-04-20 NOTE — Progress Notes (Signed)
NOB PE- pt is doing well, request genetic testing

## 2019-04-21 LAB — CBC
Hematocrit: 38 % (ref 34.0–46.6)
Hemoglobin: 12.5 g/dL (ref 11.1–15.9)
MCH: 28.7 pg (ref 26.6–33.0)
MCHC: 32.9 g/dL (ref 31.5–35.7)
MCV: 87 fL (ref 79–97)
Platelets: 204 10*3/uL (ref 150–450)
RBC: 4.36 x10E6/uL (ref 3.77–5.28)
RDW: 12.1 % (ref 11.7–15.4)
WBC: 7.9 10*3/uL (ref 3.4–10.8)

## 2019-04-22 LAB — CERVICOVAGINAL ANCILLARY ONLY
Bacterial vaginitis: NEGATIVE
Candida vaginitis: NEGATIVE

## 2019-05-05 ENCOUNTER — Encounter: Payer: Managed Care, Other (non HMO) | Admitting: Obstetrics and Gynecology

## 2019-05-18 ENCOUNTER — Other Ambulatory Visit: Payer: Self-pay

## 2019-05-18 ENCOUNTER — Other Ambulatory Visit: Payer: Managed Care, Other (non HMO)

## 2019-05-18 ENCOUNTER — Ambulatory Visit (INDEPENDENT_AMBULATORY_CARE_PROVIDER_SITE_OTHER): Payer: Managed Care, Other (non HMO) | Admitting: Obstetrics and Gynecology

## 2019-05-18 VITALS — BP 112/64 | HR 103 | Wt 207.7 lb

## 2019-05-18 DIAGNOSIS — Z3492 Encounter for supervision of normal pregnancy, unspecified, second trimester: Secondary | ICD-10-CM

## 2019-05-18 DIAGNOSIS — Z131 Encounter for screening for diabetes mellitus: Secondary | ICD-10-CM

## 2019-05-18 DIAGNOSIS — Z23 Encounter for immunization: Secondary | ICD-10-CM

## 2019-05-18 LAB — POCT URINALYSIS DIPSTICK OB
Bilirubin, UA: NEGATIVE
Blood, UA: NEGATIVE
Glucose, UA: NEGATIVE
Ketones, UA: NEGATIVE
Leukocytes, UA: NEGATIVE
Nitrite, UA: NEGATIVE
POC,PROTEIN,UA: NEGATIVE
Spec Grav, UA: 1.015 (ref 1.010–1.025)
Urobilinogen, UA: 0.2 E.U./dL
pH, UA: 7 (ref 5.0–8.0)

## 2019-05-18 NOTE — Progress Notes (Signed)
ROB- early glucola done today, pt is doing well

## 2019-05-18 NOTE — Progress Notes (Signed)
ROB & early glucola- doing well, flu vaccine given, discussed weight gain,states she is not eating as much since nausea is better. Anatomy scan next visit.

## 2019-05-19 LAB — GLUCOSE, 1 HOUR GESTATIONAL: Gestational Diabetes Screen: 77 mg/dL (ref 65–139)

## 2019-06-01 ENCOUNTER — Other Ambulatory Visit: Payer: Self-pay

## 2019-06-01 MED ORDER — CONCEPT OB 130-92.4-1 MG PO CAPS: 130.0000 mg | ORAL_CAPSULE | Freq: Every day | ORAL | 11 refills | Status: DC

## 2019-06-07 ENCOUNTER — Ambulatory Visit (INDEPENDENT_AMBULATORY_CARE_PROVIDER_SITE_OTHER): Payer: Managed Care, Other (non HMO) | Admitting: Certified Nurse Midwife

## 2019-06-07 ENCOUNTER — Encounter: Payer: Managed Care, Other (non HMO) | Admitting: Certified Nurse Midwife

## 2019-06-07 ENCOUNTER — Ambulatory Visit (INDEPENDENT_AMBULATORY_CARE_PROVIDER_SITE_OTHER): Payer: Managed Care, Other (non HMO)

## 2019-06-07 ENCOUNTER — Other Ambulatory Visit: Payer: Self-pay

## 2019-06-07 VITALS — BP 101/48 | HR 96 | Wt 211.0 lb

## 2019-06-07 DIAGNOSIS — Z363 Encounter for antenatal screening for malformations: Secondary | ICD-10-CM | POA: Diagnosis not present

## 2019-06-07 DIAGNOSIS — Z3492 Encounter for supervision of normal pregnancy, unspecified, second trimester: Secondary | ICD-10-CM

## 2019-06-07 LAB — POCT URINALYSIS DIPSTICK OB
Bilirubin, UA: NEGATIVE
Blood, UA: NEGATIVE
Glucose, UA: NEGATIVE
Ketones, UA: NEGATIVE
Leukocytes, UA: NEGATIVE
Nitrite, UA: NEGATIVE
POC,PROTEIN,UA: NEGATIVE
Spec Grav, UA: 1.01 (ref 1.010–1.025)
Urobilinogen, UA: 0.2 E.U./dL
pH, UA: 5 (ref 5.0–8.0)

## 2019-06-07 NOTE — Patient Instructions (Signed)

## 2019-06-07 NOTE — Progress Notes (Signed)
ROB doing well. Anatomy u/s today ( see below) results reviewed. Discussed anterior placenta. Discussed common discomforts of pregnancy. Pt verbalizes and agrees . Follow up 4 wks.   Philip Aspen, CNM   Patient Name: Veronica Hurst DOB: 04/08/1991 MRN: [id number] ULTRASOUND REPORT  Location: [facility] Date of Service: 06/07/2019   Indications:Anatomy Ultrasound Findings:  Single g (ton intrauterine pregnancy is visualized with FHR at 145 BPM. Biometrics give an (U/S) Gestational age of 89 w 4d and an (U/S) EDD of 10/28/2019; this correlates with the clinically established Estimated Date of Delivery: 10/26/19  Fetal presentation is Variable.  EFW: 298 g ( 11 oz ). Placenta: anterior. Grade: 1 AFI: subjectively normal.  Anatomic survey is complete and normal; Gender - female.    Right Ovary is normal in appearance. Left Ovary is normal appearance. Survey of the adnexa demonstrates no adnexal masses. There is no free peritoneal fluid in the cul de sac.  Impression: 1. [redacted]w[redacted]d Viable Singleton Intrauterine pregnancy by U/S. 2. (U/S) EDD is consistent with Clinically established Estimated Date of Delivery: 10/26/19 . 3. Normal Anatomy Scan  Recommendations: 1.Clinical correlation with the patient's History and Physical Exam.   Jenine M. Albertine Grates    RDMS

## 2019-07-06 ENCOUNTER — Ambulatory Visit (INDEPENDENT_AMBULATORY_CARE_PROVIDER_SITE_OTHER): Payer: Managed Care, Other (non HMO) | Admitting: Certified Nurse Midwife

## 2019-07-06 ENCOUNTER — Encounter: Payer: Managed Care, Other (non HMO) | Admitting: Obstetrics and Gynecology

## 2019-07-06 ENCOUNTER — Other Ambulatory Visit: Payer: Self-pay

## 2019-07-06 VITALS — BP 104/76 | HR 100 | Wt 217.4 lb

## 2019-07-06 DIAGNOSIS — Z3402 Encounter for supervision of normal first pregnancy, second trimester: Secondary | ICD-10-CM

## 2019-07-06 NOTE — Patient Instructions (Signed)
Glucose Tolerance Test During Pregnancy Why am I having this test? The glucose tolerance test (GTT) is done to check how your body processes sugar (glucose). This is one of several tests used to diagnose diabetes that develops during pregnancy (gestational diabetes mellitus). Gestational diabetes is a temporary form of diabetes that some women develop during pregnancy. It usually occurs during the second trimester of pregnancy and goes away after delivery. Testing (screening) for gestational diabetes usually occurs between 24 and 28 weeks of pregnancy. You may have the GTT test after having a 1-hour glucose screening test if the results from that test indicate that you may have gestational diabetes. You may also have this test if:  You have a history of gestational diabetes.  You have a history of giving birth to very large babies or have experienced repeated fetal loss (stillbirth).  You have signs and symptoms of diabetes, such as: ? Changes in your vision. ? Tingling or numbness in your hands or feet. ? Changes in hunger, thirst, and urination that are not otherwise explained by your pregnancy. What is being tested? This test measures the amount of glucose in your blood at different times during a period of 3 hours. This indicates how well your body is able to process glucose. What kind of sample is taken?  Blood samples are required for this test. They are usually collected by inserting a needle into a blood vessel. How do I prepare for this test?  For 3 days before your test, eat normally. Have plenty of carbohydrate-rich foods.  Follow instructions from your health care provider about: ? Eating or drinking restrictions on the day of the test. You may be asked to not eat or drink anything other than water (fast) starting 8-10 hours before the test. ? Changing or stopping your regular medicines. Some medicines may interfere with this test. Tell a health care provider about:  All  medicines you are taking, including vitamins, herbs, eye drops, creams, and over-the-counter medicines.  Any blood disorders you have.  Any surgeries you have had.  Any medical conditions you have. What happens during the test? First, your blood glucose will be measured. This is referred to as your fasting blood glucose, since you fasted before the test. Then, you will drink a glucose solution that contains a certain amount of glucose. Your blood glucose will be measured again 1, 2, and 3 hours after drinking the solution. This test takes about 3 hours to complete. You will need to stay at the testing location during this time. During the testing period:  Do not eat or drink anything other than the glucose solution.  Do not exercise.  Do not use any products that contain nicotine or tobacco, such as cigarettes and e-cigarettes. If you need help stopping, ask your health care provider. The testing procedure may vary among health care providers and hospitals. How are the results reported? Your results will be reported as milligrams of glucose per deciliter of blood (mg/dL) or millimoles per liter (mmol/L). Your health care provider will compare your results to normal ranges that were established after testing a large group of people (reference ranges). Reference ranges may vary among labs and hospitals. For this test, common reference ranges are:  Fasting: less than 95-105 mg/dL (5.3-5.8 mmol/L).  1 hour after drinking glucose: less than 180-190 mg/dL (10.0-10.5 mmol/L).  2 hours after drinking glucose: less than 155-165 mg/dL (8.6-9.2 mmol/L).  3 hours after drinking glucose: 140-145 mg/dL (7.8-8.1 mmol/L). What do the   results mean? Results within reference ranges are considered normal, meaning that your glucose levels are well-controlled. If two or more of your blood glucose levels are high, you may be diagnosed with gestational diabetes. If only one level is high, your health care  provider may suggest repeat testing or other tests to confirm a diagnosis. Talk with your health care provider about what your results mean. Questions to ask your health care provider Ask your health care provider, or the department that is doing the test:  When will my results be ready?  How will I get my results?  What are my treatment options?  What other tests do I need?  What are my next steps? Summary  The glucose tolerance test (GTT) is one of several tests used to diagnose diabetes that develops during pregnancy (gestational diabetes mellitus). Gestational diabetes is a temporary form of diabetes that some women develop during pregnancy.  You may have the GTT test after having a 1-hour glucose screening test if the results from that test indicate that you may have gestational diabetes. You may also have this test if you have any symptoms or risk factors for gestational diabetes.  Talk with your health care provider about what your results mean. This information is not intended to replace advice given to you by your health care provider. Make sure you discuss any questions you have with your health care provider. Document Released: 02/03/2012 Document Revised: 11/25/2018 Document Reviewed: 03/16/2017 Elsevier Patient Education  2020 [employer]  

## 2019-07-06 NOTE — Progress Notes (Signed)
ROB doing well. Feels movement. Anticipatory guidance for 28 wk visit. Discussed 3 sister of balance information given and self management guide for common pregnancy related musculoskeletal problems given. PT states she fell last night on her knee. She did not hit her belly. She hit her right side.  She denies ctx, bleeding, LOF and is feeling fetal movement. Reviewed PTL precautions.  Reassurance given. Folow up 4 wks.   Philip Aspen, CNM

## 2019-08-03 ENCOUNTER — Other Ambulatory Visit: Payer: Managed Care, Other (non HMO)

## 2019-08-03 ENCOUNTER — Ambulatory Visit (INDEPENDENT_AMBULATORY_CARE_PROVIDER_SITE_OTHER): Payer: Managed Care, Other (non HMO) | Admitting: Certified Nurse Midwife

## 2019-08-03 ENCOUNTER — Other Ambulatory Visit: Payer: Self-pay

## 2019-08-03 ENCOUNTER — Encounter: Payer: Self-pay | Admitting: Certified Nurse Midwife

## 2019-08-03 VITALS — BP 111/58 | HR 110 | Wt 224.2 lb

## 2019-08-03 DIAGNOSIS — Z3403 Encounter for supervision of normal first pregnancy, third trimester: Secondary | ICD-10-CM

## 2019-08-03 LAB — POCT URINALYSIS DIPSTICK OB
Bilirubin, UA: NEGATIVE
Blood, UA: NEGATIVE
Glucose, UA: NEGATIVE
Ketones, UA: NEGATIVE
Leukocytes, UA: NEGATIVE
Nitrite, UA: NEGATIVE
POC,PROTEIN,UA: NEGATIVE
Spec Grav, UA: 1.02 (ref 1.010–1.025)
Urobilinogen, UA: 0.2 E.U./dL
pH, UA: 5 (ref 5.0–8.0)

## 2019-08-03 MED ORDER — TETANUS-DIPHTH-ACELL PERTUSSIS 5-2.5-18.5 LF-MCG/0.5 IM SUSP: 0.5000 mL | Freq: Once | INTRAMUSCULAR | Status: DC

## 2019-08-03 MED ORDER — TETANUS-DIPHTH-ACELL PERTUSSIS 5-2.5-18.5 LF-MCG/0.5 IM SUSP
0.5000 mL | Freq: Once | INTRAMUSCULAR | Status: AC
  Administered 2019-08-03: 0.5 mL via INTRAMUSCULAR

## 2019-08-03 MED ORDER — FLUCONAZOLE 150 MG PO TABS: 150.0000 mg | ORAL_TABLET | Freq: Once | ORAL | 1 refills | Status: DC

## 2019-08-03 NOTE — Progress Notes (Signed)
ROB doing well. Feels good movement. 28 wk labs today: CBC/RPR/Glucose/Tdap/BTC. Birth control discussed. Plan pull out method. Birth plan discussed, sample given. Will review next visit. Pt complains of rash on inner thighs, suspicious for yeast infection. Bilateral redness noted in crease of leg. Recommend topical monistat. Diflucan ordered. RSB completed in October. Follow up 2 wks.   Philip Aspen, CNM

## 2019-08-03 NOTE — Patient Instructions (Signed)
Td Vaccine (Tetanus and Diphtheria): What You Need to Know 1. Why get vaccinated? Tetanus  and diphtheria are very serious diseases. They are rare in the [place] today, but people who do become infected often have severe complications. Td vaccine is used to protect adolescents and adults from both of these diseases. Both tetanus and diphtheria are infections caused by bacteria. Diphtheria spreads from person to person through coughing or sneezing. Tetanus-causing bacteria enter the body through cuts, scratches, or wounds. TETANUS (Lockjaw) causes painful muscle tightening and stiffness, usually all over the body.  It can lead to tightening of muscles in the head and neck so you can't open your mouth, swallow, or sometimes even breathe. Tetanus kills about 1 out of every 10 people who are infected even after receiving the best medical care. DIPHTHERIA can cause a thick coating to form in the back of the throat.  It can lead to breathing problems, paralysis, heart failure, and death. Before vaccines, as many as 200,000 cases of diphtheria and hundreds of cases of tetanus were reported in the [place] each year. Since vaccination began, reports of cases for both diseases have dropped by about 99%. 2. Td vaccine Td vaccine can protect adolescents and adults from tetanus and diphtheria. Td is usually given as a booster dose every 10 years but it can also be given earlier after a severe and dirty wound or burn. Another vaccine, called Tdap, which protects against pertussis in addition to tetanus and diphtheria, is sometimes recommended instead of Td vaccine. Your doctor or the person giving you the vaccine can give you more information. Td may safely be given at the same time as other vaccines. 3. Some people should not get this vaccine  A person who has ever had a life-threatening allergic reaction after a previous dose of any tetanus or diphtheria containing vaccine, OR has a severe allergy  to any part of this vaccine, should not get Td vaccine. Tell the person giving the vaccine about any severe allergies.  Talk to your doctor if you: ? had severe pain or swelling after any vaccine containing diphtheria or tetanus, ? ever had a condition called Guillain Barr Syndrome (GBS), ? aren't feeling well on the day the shot is scheduled. 4. Risks of a vaccine reaction With any medicine, including vaccines, there is a chance of side effects. These are usually mild and go away on their own. Serious reactions are also possible but are rare. Most people who get Td vaccine do not have any problems with it. Mild Problems following Td vaccine: (Did not interfere with activities)  Pain where the shot was given (about 8 people in 10)  Redness or swelling where the shot was given (about 1 person in 4)  Mild fever (rare)  Headache (about 1 person in 4)  Tiredness (about 1 person in 4) Moderate Problems following Td vaccine: (Interfered with activities, but did not require medical attention)  Fever over 102F (rare) Severe Problems following Td vaccine: (Unable to perform usual activities; required medical attention)  Swelling, severe pain, bleeding and/or redness in the arm where the shot was given (rare). Problems that could happen after any vaccine:  People sometimes faint after a medical procedure, including vaccination. Sitting or lying down for about 15 minutes can help prevent fainting, and injuries caused by a fall. Tell your doctor if you feel dizzy, or have vision changes or ringing in the ears.  Some people get severe pain in the shoulder and have   difficulty moving the arm where a shot was given. This happens very rarely.  Any medication can cause a severe allergic reaction. Such reactions from a vaccine are very rare, estimated at fewer than 1 in a million doses, and would happen within a few minutes to a few hours after the vaccination. As with any medicine, there is a  very remote chance of a vaccine causing a serious injury or death. The safety of vaccines is always being monitored. For more information, visit: www.cdc.gov/vaccinesafety/ 5. What if there is a serious reaction? What should I look for?  Look for anything that concerns you, such as signs of a severe allergic reaction, very high fever, or unusual behavior. Signs of a severe allergic reaction can include hives, swelling of the face and throat, difficulty breathing, a fast heartbeat, dizziness, and weakness. These would usually start a few minutes to a few hours after the vaccination. What should I do?  If you think it is a severe allergic reaction or other emergency that can't wait, call 9-1-1 or get the person to the nearest hospital. Otherwise, call your doctor.  Afterward, the reaction should be reported to the Vaccine Adverse Event Reporting System (VAERS). Your doctor might file this report, or you can do it yourself through the VAERS web site at www.vaers.hhs.gov, or by calling 1-800-822-7967. VAERS does not give medical advice. 6. The National Vaccine Injury Compensation Program The National Vaccine Injury Compensation Program (VICP) is a federal program that was created to compensate people who may have been injured by certain vaccines. Persons who believe they may have been injured by a vaccine can learn about the program and about filing a claim by calling 1-800-338-2382 or visiting the VICP website at www.hrsa.gov/vaccinecompensation. There is a time limit to file a claim for compensation. 7. How can I learn more?  Ask your doctor. He or she can give you the vaccine package insert or suggest other sources of information.  Call your local or state health department.  Contact the Centers for Disease Control and Prevention (CDC): ? Call 1-800-232-4636 (1-800-CDC-INFO) ? Visit CDC's website at www.cdc.gov/vaccines Vaccine Information Statement Td Vaccine (11/27/15) This information is  not intended to replace advice given to you by your health care provider. Make sure you discuss any questions you have with your health care provider. Document Released: 06/01/2006 Document Revised: 03/22/2018 Document Reviewed: 03/22/2018 Elsevier Interactive Patient Education  2020 Elsevier Inc. Glucose Tolerance Test During Pregnancy Why am I having this test? The glucose tolerance test (GTT) is done to check how your body processes sugar (glucose). This is one of several tests used to diagnose diabetes that develops during pregnancy (gestational diabetes mellitus). Gestational diabetes is a temporary form of diabetes that some women develop during pregnancy. It usually occurs during the second trimester of pregnancy and goes away after delivery. Testing (screening) for gestational diabetes usually occurs between 24 and 28 weeks of pregnancy. You may have the GTT test after having a 1-hour glucose screening test if the results from that test indicate that you may have gestational diabetes. You may also have this test if:  You have a history of gestational diabetes.  You have a history of giving birth to very large babies or have experienced repeated fetal loss (stillbirth).  You have signs and symptoms of diabetes, such as: ? Changes in your vision. ? Tingling or numbness in your hands or feet. ? Changes in hunger, thirst, and urination that are not otherwise explained by your pregnancy.   What is being tested? This test measures the amount of glucose in your blood at different times during a period of 3 hours. This indicates how well your body is able to process glucose. What kind of sample is taken?  Blood samples are required for this test. They are usually collected by inserting a needle into a blood vessel. How do I prepare for this test?  For 3 days before your test, eat normally. Have plenty of carbohydrate-rich foods.  Follow instructions from your health care provider  about: ? Eating or drinking restrictions on the day of the test. You may be asked to not eat or drink anything other than water (fast) starting 8-10 hours before the test. ? Changing or stopping your regular medicines. Some medicines may interfere with this test. Tell a health care provider about:  All medicines you are taking, including vitamins, herbs, eye drops, creams, and over-the-counter medicines.  Any blood disorders you have.  Any surgeries you have had.  Any medical conditions you have. What happens during the test? First, your blood glucose will be measured. This is referred to as your fasting blood glucose, since you fasted before the test. Then, you will drink a glucose solution that contains a certain amount of glucose. Your blood glucose will be measured again 1, 2, and 3 hours after drinking the solution. This test takes about 3 hours to complete. You will need to stay at the testing location during this time. During the testing period:  Do not eat or drink anything other than the glucose solution.  Do not exercise.  Do not use any products that contain nicotine or tobacco, such as cigarettes and e-cigarettes. If you need help stopping, ask your health care provider. The testing procedure may vary among health care providers and hospitals. How are the results reported? Your results will be reported as milligrams of glucose per deciliter of blood (mg/dL) or millimoles per liter (mmol/L). Your health care provider will compare your results to normal ranges that were established after testing a large group of people (reference ranges). Reference ranges may vary among labs and hospitals. For this test, common reference ranges are:  Fasting: less than 95-105 mg/dL (5.3-5.8 mmol/L).  1 hour after drinking glucose: less than 180-190 mg/dL (10.0-10.5 mmol/L).  2 hours after drinking glucose: less than 155-165 mg/dL (8.6-9.2 mmol/L).  3 hours after drinking glucose: 140-145  mg/dL (7.8-8.1 mmol/L). What do the results mean? Results within reference ranges are considered normal, meaning that your glucose levels are well-controlled. If two or more of your blood glucose levels are high, you may be diagnosed with gestational diabetes. If only one level is high, your health care provider may suggest repeat testing or other tests to confirm a diagnosis. Talk with your health care provider about what your results mean. Questions to ask your health care provider Ask your health care provider, or the department that is doing the test:  When will my results be ready?  How will I get my results?  What are my treatment options?  What other tests do I need?  What are my next steps? Summary  The glucose tolerance test (GTT) is one of several tests used to diagnose diabetes that develops during pregnancy (gestational diabetes mellitus). Gestational diabetes is a temporary form of diabetes that some women develop during pregnancy.  You may have the GTT test after having a 1-hour glucose screening test if the results from that test indicate that you may have gestational diabetes.   You may also have this test if you have any symptoms or risk factors for gestational diabetes.  Talk with your health care provider about what your results mean. This information is not intended to replace advice given to you by your health care provider. Make sure you discuss any questions you have with your health care provider. Document Released: 02/03/2012 Document Revised: 11/25/2018 Document Reviewed: 03/16/2017 Elsevier Patient Education  2020 Elsevier Inc.  

## 2019-08-04 LAB — CBC
Hematocrit: 36.5 % (ref 34.0–46.6)
Hemoglobin: 12.6 g/dL (ref 11.1–15.9)
MCH: 29.8 pg (ref 26.6–33.0)
MCHC: 34.5 g/dL (ref 31.5–35.7)
MCV: 86 fL (ref 79–97)
Platelets: 198 10*3/uL (ref 150–450)
RBC: 4.23 x10E6/uL (ref 3.77–5.28)
RDW: 12.2 % (ref 11.7–15.4)
WBC: 10.5 10*3/uL (ref 3.4–10.8)

## 2019-08-04 LAB — RPR: RPR Ser Ql: NONREACTIVE

## 2019-08-04 LAB — GLUCOSE, 1 HOUR GESTATIONAL: Gestational Diabetes Screen: 105 mg/dL (ref 65–139)

## 2019-08-17 ENCOUNTER — Encounter: Payer: Self-pay | Admitting: Certified Nurse Midwife

## 2019-08-17 ENCOUNTER — Ambulatory Visit (INDEPENDENT_AMBULATORY_CARE_PROVIDER_SITE_OTHER): Payer: Managed Care, Other (non HMO) | Admitting: Certified Nurse Midwife

## 2019-08-17 ENCOUNTER — Other Ambulatory Visit: Payer: Self-pay

## 2019-08-17 VITALS — BP 119/75 | HR 101 | Wt 230.1 lb

## 2019-08-17 DIAGNOSIS — Z3403 Encounter for supervision of normal first pregnancy, third trimester: Secondary | ICD-10-CM

## 2019-08-17 NOTE — Progress Notes (Signed)
ROB doing well. Feels movement. Discussed upcoming visit q 2 wks until 36 wks. Reviewed GBS testing. Follow up 2 wk.   Philip Aspen, [employer]

## 2019-08-31 ENCOUNTER — Ambulatory Visit (INDEPENDENT_AMBULATORY_CARE_PROVIDER_SITE_OTHER): Payer: Managed Care, Other (non HMO) | Admitting: Certified Nurse Midwife

## 2019-08-31 ENCOUNTER — Encounter: Payer: Self-pay | Admitting: Certified Nurse Midwife

## 2019-08-31 ENCOUNTER — Other Ambulatory Visit: Payer: Self-pay

## 2019-08-31 VITALS — BP 107/73 | HR 90 | Wt 234.4 lb

## 2019-08-31 DIAGNOSIS — Z3403 Encounter for supervision of normal first pregnancy, third trimester: Secondary | ICD-10-CM

## 2019-08-31 DIAGNOSIS — Z3A32 32 weeks gestation of pregnancy: Secondary | ICD-10-CM

## 2019-08-31 LAB — POCT URINALYSIS DIPSTICK OB
Bilirubin, UA: NEGATIVE
Blood, UA: NEGATIVE
Glucose, UA: NEGATIVE
Ketones, UA: NEGATIVE
Leukocytes, UA: NEGATIVE
Nitrite, UA: NEGATIVE
POC,PROTEIN,UA: NEGATIVE
Spec Grav, UA: 1.015 (ref 1.010–1.025)
Urobilinogen, UA: 0.2 E.U./dL
pH, UA: 5 (ref 5.0–8.0)

## 2019-08-31 NOTE — Patient Instructions (Signed)
[facility]  [facility]  [address], [place], [place] [address]  Phone: [phone]  [facility] (second location)  [address] [place], [place] [address]  Phone: [phone]  [facility] ([place]) [address], [place], [place] [address] Phone: [phone]  [facility]  [address] , [place] [address]  Phone: [phone] 

## 2019-08-31 NOTE — Progress Notes (Signed)
ROB doing well. Feels baby move. State she had a fall yesterday after getting out of shower slipped and caught herself with hand fell back on bottom. Did not hit stomach or side. Denies ctx, bleeding and has felt fetal movement. Reassurance given. Complains of carpule tunnel syndrome. Handout given. Encouraged exercises for wrist and wrist guards. Follow up 2 wks.   Doreene Burke, CNM

## 2019-09-13 ENCOUNTER — Encounter: Payer: Managed Care, Other (non HMO) | Admitting: Certified Nurse Midwife

## 2019-09-14 ENCOUNTER — Other Ambulatory Visit: Payer: Self-pay

## 2019-09-14 ENCOUNTER — Encounter: Payer: Managed Care, Other (non HMO) | Admitting: Certified Nurse Midwife

## 2019-09-14 ENCOUNTER — Ambulatory Visit (INDEPENDENT_AMBULATORY_CARE_PROVIDER_SITE_OTHER): Payer: Managed Care, Other (non HMO) | Admitting: Certified Nurse Midwife

## 2019-09-14 ENCOUNTER — Encounter: Payer: Self-pay | Admitting: Certified Nurse Midwife

## 2019-09-14 VITALS — BP 102/59 | HR 98 | Wt 234.2 lb

## 2019-09-14 DIAGNOSIS — Z3403 Encounter for supervision of normal first pregnancy, third trimester: Secondary | ICD-10-CM

## 2019-09-14 LAB — POCT URINALYSIS DIPSTICK OB
Bilirubin, UA: NEGATIVE
Blood, UA: NEGATIVE
Glucose, UA: NEGATIVE
Ketones, UA: NEGATIVE
Leukocytes, UA: NEGATIVE
Nitrite, UA: NEGATIVE
POC,PROTEIN,UA: NEGATIVE
Spec Grav, UA: 1.015 (ref 1.010–1.025)
Urobilinogen, UA: 0.2 E.U./dL
pH, UA: 5 (ref 5.0–8.0)

## 2019-09-14 NOTE — Progress Notes (Signed)
Lactation student provided education in accordance with Ready,Set,Baby curriculum. Mother of baby reported all questions have been answered.

## 2019-09-14 NOTE — Progress Notes (Signed)
ROB doing well. Feels good movement. Discussed birth plan she plains to wait and see how labor goes. She is open to IV and or epidural if needed. She plans to breastfeed. Discussed 36 wk visit. Follow up 2 wks.   Doreene Burke, CNM .

## 2019-09-14 NOTE — Patient Instructions (Signed)
[facility]  [facility]  [address], [place], [place] [address]  Phone: [phone]  [facility] (second location)  [address] [place], [place] [address]  Phone: [phone]  [facility] ([place]) [address], [place], [place] [address] Phone: [phone]  [facility]  [address] [place], [place] [address]  Phone: [phone] 

## 2019-09-21 ENCOUNTER — Other Ambulatory Visit: Payer: Self-pay | Admitting: Certified Nurse Midwife

## 2019-09-28 ENCOUNTER — Encounter: Payer: Managed Care, Other (non HMO) | Admitting: Certified Nurse Midwife

## 2019-09-28 ENCOUNTER — Telehealth: Payer: Self-pay

## 2019-09-28 NOTE — Telephone Encounter (Signed)
mychart message sent to patient

## 2019-09-30 ENCOUNTER — Other Ambulatory Visit: Payer: Self-pay

## 2019-09-30 ENCOUNTER — Ambulatory Visit (INDEPENDENT_AMBULATORY_CARE_PROVIDER_SITE_OTHER): Payer: Managed Care, Other (non HMO) | Admitting: Certified Nurse Midwife

## 2019-09-30 ENCOUNTER — Encounter: Payer: Self-pay | Admitting: Certified Nurse Midwife

## 2019-09-30 VITALS — BP 93/73 | HR 117 | Wt 244.1 lb

## 2019-09-30 DIAGNOSIS — Z3403 Encounter for supervision of normal first pregnancy, third trimester: Secondary | ICD-10-CM

## 2019-09-30 LAB — POCT URINALYSIS DIPSTICK OB
Bilirubin, UA: NEGATIVE
Blood, UA: NEGATIVE
Glucose, UA: NEGATIVE
Ketones, UA: NEGATIVE
Leukocytes, UA: NEGATIVE
Nitrite, UA: NEGATIVE
POC,PROTEIN,UA: NEGATIVE
Spec Grav, UA: 1.01 (ref 1.010–1.025)
Urobilinogen, UA: 0.2 E.U./dL
pH, UA: 5 (ref 5.0–8.0)

## 2019-09-30 NOTE — Addendum Note (Signed)
Addended by: Brooke Dare on: 09/30/2019 04:16 PM   Modules accepted: Orders

## 2019-09-30 NOTE — Progress Notes (Signed)
pre

## 2019-09-30 NOTE — Patient Instructions (Signed)
Group B Streptococcus Infection During Pregnancy °Group B Streptococcus (GBS) is a type of bacteria that is often found in healthy people. It is commonly found in the rectum, vagina, and intestines. In people who are healthy and not pregnant, the bacteria rarely cause serious illness or complications. However, women who test positive for GBS during pregnancy can pass the bacteria to the baby during childbirth. This can cause serious infection in the baby after birth. °Women with GBS may also have infections during their pregnancy or soon after childbirth. The infections include urinary tract infections (UTIs) or infections of the uterus. GBS also increases a woman's risk of complications during pregnancy, such as early labor or delivery, miscarriage, or stillbirth. Routine testing for GBS is recommended for all pregnant women. °What are the causes? °This condition is caused by bacteria called Streptococcus agalactiae. °What increases the risk? °You may have a higher risk for GBS infection during pregnancy if you had one during a past pregnancy. °What are the signs or symptoms? °In most cases, GBS infection does not cause symptoms in pregnant women. If symptoms exist, they may include: °· Labor that starts before the 37th week of pregnancy. °· A UTI or bladder infection. This may cause a fever, frequent urination, or pain and burning during urination. °· Fever during labor. There can also be a rapid heartbeat in the mother or baby. °Rare but serious symptoms of a GBS infection in women include: °· Blood infection (septicemia). This may cause fever, chills, or confusion. °· Lung infection (pneumonia). This may cause fever, chills, cough, rapid breathing, chest pain, or difficulty breathing. °· Bone, joint, skin, or soft tissue infection. °How is this diagnosed? °You may be screened for GBS between week 35 and week 37 of pregnancy. If you have symptoms of preterm labor, you may be screened earlier. This condition is  diagnosed based on lab test results from: °· A swab of fluid from the vagina and rectum. °· A urine sample. °How is this treated? °This condition is treated with antibiotic medicine. Antibiotic medicine may be given: °· To you when you go into labor, or as soon as your water breaks. The medicines will continue until after you give birth. If you are having a cesarean delivery, you do not need antibiotics unless your water has broken. °· To your baby, if he or she requires treatment. Your health care provider will check your baby to decide if he or she needs antibiotics to prevent a serious infection. °Follow these instructions at home: °· Take over-the-counter and prescription medicines only as told by your health care provider. °· Take your antibiotic medicine as told by your health care provider. Do not stop taking the antibiotic even if you start to feel better. °· Keep all pre-birth (prenatal) visits and follow-up visits as told by your health care provider. This is important. °Contact a health care provider if: °· You have pain or burning when you urinate. °· You have to urinate more often than usual. °· You have a fever or chills. °· You develop a bad-smelling vaginal discharge. °Get help right away if: °· Your water breaks. °· You go into labor. °· You have severe pain in your abdomen. °· You have difficulty breathing. °· You have chest pain. °These symptoms may represent a serious problem that is an emergency. Do not wait to see if the symptoms will go away. Get medical help right away. Call your local emergency services (911 in the [place] Do not drive yourself to   the hospital. °Summary °· GBS is a type of bacteria that is common in healthy people. °· During pregnancy, colonization with GBS can cause serious complications for you or your baby. °· Your health care provider will screen you between 35 and 37 weeks of pregnancy to determine if you are colonized with GBS. °· If you are colonized with GBS during  pregnancy, your health care provider will recommend antibiotics through an IV during labor. °· After delivery, your baby will be evaluated for complications related to potential GBS infection and may require antibiotics to prevent a serious infection. °This information is not intended to replace advice given to you by your health care provider. Make sure you discuss any questions you have with your health care provider. °Document Revised: 02/28/2019 Document Reviewed: 02/28/2019 °Elsevier Patient Education © 2020 Elsevier Inc. ° °

## 2019-09-30 NOTE — Progress Notes (Signed)
ROB doing well. Feels good movement. GBS and cultures today. Herbal prep handout given. Labor precautions reviewed. SVE closed/thick/high. Vertex per leopold's maneuver, unable to reach presenting part on exam. Follow up 1 wk with Marcelino Duster.   Doreene Burke, CNM

## 2019-10-02 LAB — STREP GP B NAA: Strep Gp B NAA: NEGATIVE

## 2019-10-03 LAB — GC/CHLAMYDIA PROBE AMP
Chlamydia trachomatis, NAA: NEGATIVE
Neisseria Gonorrhoeae by PCR: NEGATIVE

## 2019-10-06 ENCOUNTER — Encounter: Payer: Managed Care, Other (non HMO) | Admitting: Certified Nurse Midwife

## 2019-10-07 ENCOUNTER — Other Ambulatory Visit: Payer: Self-pay

## 2019-10-07 ENCOUNTER — Ambulatory Visit (INDEPENDENT_AMBULATORY_CARE_PROVIDER_SITE_OTHER): Payer: Managed Care, Other (non HMO) | Admitting: Certified Nurse Midwife

## 2019-10-07 VITALS — BP 92/55 | HR 95 | Wt 246.6 lb

## 2019-10-07 DIAGNOSIS — O99210 Obesity complicating pregnancy, unspecified trimester: Secondary | ICD-10-CM

## 2019-10-07 DIAGNOSIS — O2600 Excessive weight gain in pregnancy, unspecified trimester: Secondary | ICD-10-CM

## 2019-10-07 DIAGNOSIS — Z3493 Encounter for supervision of normal pregnancy, unspecified, third trimester: Secondary | ICD-10-CM

## 2019-10-07 DIAGNOSIS — Z3A37 37 weeks gestation of pregnancy: Secondary | ICD-10-CM

## 2019-10-07 LAB — POCT URINALYSIS DIPSTICK OB
Bilirubin, UA: NEGATIVE
Blood, UA: NEGATIVE
Glucose, UA: NEGATIVE
Ketones, UA: NEGATIVE
Leukocytes, UA: NEGATIVE
Nitrite, UA: NEGATIVE
POC,PROTEIN,UA: NEGATIVE
Spec Grav, UA: 1.01 (ref 1.010–1.025)
Urobilinogen, UA: 0.2 E.U./dL
pH, UA: 6 (ref 5.0–8.0)

## 2019-10-07 NOTE — Patient Instructions (Signed)
Vaginal Delivery  Vaginal delivery means that you give birth by pushing your baby out of your birth canal (vagina). A team of health care providers will help you before, during, and after vaginal delivery. Birth experiences are unique for every woman and every pregnancy, and birth experiences vary depending on where you choose to give birth. What happens when I arrive at the birth center or hospital? Once you are in labor and have been admitted into the hospital or birth center, your health care provider may:  Review your pregnancy history and any concerns that you have.  Insert an IV into one of your veins. This may be used to give you fluids and medicines.  Check your blood pressure, pulse, temperature, and heart rate (vital signs).  Check whether your bag of water (amniotic sac) has broken (ruptured).  Talk with you about your birth plan and discuss pain control options. Monitoring Your health care provider may monitor your contractions (uterine monitoring) and your baby's heart rate (fetal monitoring). You may need to be monitored:  Often, but not continuously (intermittently).  All the time or for long periods at a time (continuously). Continuous monitoring may be needed if: ? You are taking certain medicines, such as medicine to relieve pain or make your contractions stronger. ? You have pregnancy or labor complications. Monitoring may be done by:  Placing a special stethoscope or a handheld monitoring device on your abdomen to check your baby's heartbeat and to check for contractions.  Placing monitors on your abdomen (external monitors) to record your baby's heartbeat and the frequency and length of contractions.  Placing monitors inside your uterus through your vagina (internal monitors) to record your baby's heartbeat and the frequency, length, and strength of your contractions. Depending on the type of monitor, it may remain in your uterus or on your baby's head until  birth.  Telemetry. This is a type of continuous monitoring that can be done with external or internal monitors. Instead of having to stay in bed, you are able to move around during telemetry. Physical exam Your health care provider may perform frequent physical exams. This may include:  Checking how and where your baby is positioned in your uterus.  Checking your cervix to determine: ? Whether it is thinning out (effacing). ? Whether it is opening up (dilating). What happens during labor and delivery?  Normal labor and delivery is divided into the following three stages: Stage 1  This is the longest stage of labor.  This stage can last for hours or days.  Throughout this stage, you will feel contractions. Contractions generally feel mild, infrequent, and irregular at first. They get stronger, more frequent (about every 2-3 minutes), and more regular as you move through this stage.  This stage ends when your cervix is completely dilated to 4 inches (10 cm) and completely effaced. Stage 2  This stage starts once your cervix is completely effaced and dilated and lasts until the delivery of your baby.  This stage may last from 20 minutes to 2 hours.  This is the stage where you will feel an urge to push your baby out of your vagina.  You may feel stretching and burning pain, especially when the widest part of your baby's head passes through the vaginal opening (crowning).  Once your baby is delivered, the umbilical cord will be clamped and cut. This usually occurs after waiting a period of 1-2 minutes after delivery.  Your baby will be placed on your bare chest (  skin-to-skin contact) in an upright position and covered with a warm blanket. Watch your baby for feeding cues, like rooting or sucking, and help the baby to your breast for his or her first feeding. Stage 3  This stage starts immediately after the birth of your baby and ends after you deliver the placenta.  This stage may  take anywhere from 5 to 30 minutes.  After your baby has been delivered, you will feel contractions as your body expels the placenta and your uterus contracts to control bleeding. What can I expect after labor and delivery?  After labor is over, you and your baby will be monitored closely until you are ready to go home to ensure that you are both healthy. Your health care team will teach you how to care for yourself and your baby.  You and your baby will stay in the same room (rooming in) during your hospital stay. This will encourage early bonding and successful breastfeeding.  You may continue to receive fluids and medicines through an IV.  Your uterus will be checked and massaged regularly (fundal massage).  You will have some soreness and pain in your abdomen, vagina, and the area of skin between your vaginal opening and your anus (perineum).  If an incision was made near your vagina (episiotomy) or if you had some vaginal tearing during delivery, cold compresses may be placed on your episiotomy or your tear. This helps to reduce pain and swelling.  You may be given a squirt bottle to use instead of wiping when you go to the bathroom. To use the squirt bottle, follow these steps: ? Before you urinate, fill the squirt bottle with warm water. Do not use hot water. ? After you urinate, while you are sitting on the toilet, use the squirt bottle to rinse the area around your urethra and vaginal opening. This rinses away any urine and blood. ? Fill the squirt bottle with clean water every time you use the bathroom.  It is normal to have vaginal bleeding after delivery. Wear a sanitary pad for vaginal bleeding and discharge. Summary  Vaginal delivery means that you will give birth by pushing your baby out of your birth canal (vagina).  Your health care provider may monitor your contractions (uterine monitoring) and your baby's heart rate (fetal monitoring).  Your health care provider may  perform a physical exam.  Normal labor and delivery is divided into three stages.  After labor is over, you and your baby will be monitored closely until you are ready to go home. This information is not intended to replace advice given to you by your health care provider. Make sure you discuss any questions you have with your health care provider. Document Revised: 09/08/2017 Document Reviewed: 09/08/2017 Elsevier Patient Education  2020 [employer]    Fetal Movement Counts Patient Name: ________________________________________________ Patient Due Date: ____________________ What is a fetal movement count?  A fetal movement count is the number of times that you feel your baby move during a certain amount of time. This may also be called a fetal kick count. A fetal movement count is recommended for every pregnant woman. You may be asked to start counting fetal movements as early as week 28 of your pregnancy. Pay attention to when your baby is most active. You may notice your baby's sleep and wake cycles. You may also notice things that make your baby move more. You should do a fetal movement count:  When your baby is normally most   active.  At the same time each day. A good time to count movements is while you are resting, after having something to eat and drink. How do I count fetal movements? 1. Find a quiet, comfortable area. Sit, or lie down on your side. 2. Write down the date, the start time and stop time, and the number of movements that you felt between those two times. Take this information with you to your health care visits. 3. Write down your start time when you feel the first movement. 4. Count kicks, flutters, swishes, rolls, and jabs. You should feel at least 10 movements. 5. You may stop counting after you have felt 10 movements, or if you have been counting for 2 hours. Write down the stop time. 6. If you do not feel 10 movements in 2 hours, contact your health care  provider for further instructions. Your health care provider may want to do additional tests to assess your baby's well-being. Contact a health care provider if:  You feel fewer than 10 movements in 2 hours.  Your baby is not moving like he or she usually does. Date: ____________ Start time: ____________ Stop time: ____________ Movements: ____________ Date: ____________ Start time: ____________ Stop time: ____________ Movements: ____________ Date: ____________ Start time: ____________ Stop time: ____________ Movements: ____________ Date: ____________ Start time: ____________ Stop time: ____________ Movements: ____________ Date: ____________ Start time: ____________ Stop time: ____________ Movements: ____________ Date: ____________ Start time: ____________ Stop time: ____________ Movements: ____________ Date: ____________ Start time: ____________ Stop time: ____________ Movements: ____________ Date: ____________ Start time: ____________ Stop time: ____________ Movements: ____________ Date: ____________ Start time: ____________ Stop time: ____________ Movements: ____________ This information is not intended to replace advice given to you by your health care provider. Make sure you discuss any questions you have with your health care provider. Document Revised: 03/24/2019 Document Reviewed: 03/24/2019 Elsevier Patient Education  2020 [employer] Inc.  

## 2019-10-07 NOTE — Progress Notes (Signed)
ROB-Patient c/o intermittent pelvic pressure x2 weeks, will start using belly belt.

## 2019-10-08 NOTE — Progress Notes (Signed)
ROB-Report intermittent pelvic pain for the last two (2) weeks. Discussed home treatment measures including use of abdominal support. Advised increased hydration and protein for nightly swelling in lower extremities. Circumcision information sent via MyChart. Declines IOL at this time. Anticipatory guidance regarding course of prenatal care. RTC x 1 week for growth ultrasound, NST, and ROB or sooner if needed.

## 2019-10-12 ENCOUNTER — Other Ambulatory Visit: Payer: Managed Care, Other (non HMO)

## 2019-10-12 ENCOUNTER — Ambulatory Visit (INDEPENDENT_AMBULATORY_CARE_PROVIDER_SITE_OTHER): Payer: Managed Care, Other (non HMO)

## 2019-10-12 ENCOUNTER — Other Ambulatory Visit: Payer: Self-pay

## 2019-10-12 ENCOUNTER — Ambulatory Visit (INDEPENDENT_AMBULATORY_CARE_PROVIDER_SITE_OTHER): Payer: Managed Care, Other (non HMO) | Admitting: Certified Nurse Midwife

## 2019-10-12 ENCOUNTER — Encounter: Payer: Self-pay | Admitting: Certified Nurse Midwife

## 2019-10-12 VITALS — BP 103/72 | HR 100 | Wt 246.4 lb

## 2019-10-12 DIAGNOSIS — Z3493 Encounter for supervision of normal pregnancy, unspecified, third trimester: Secondary | ICD-10-CM

## 2019-10-12 DIAGNOSIS — Z3689 Encounter for other specified antenatal screening: Secondary | ICD-10-CM | POA: Diagnosis not present

## 2019-10-12 DIAGNOSIS — O2600 Excessive weight gain in pregnancy, unspecified trimester: Secondary | ICD-10-CM | POA: Diagnosis not present

## 2019-10-12 DIAGNOSIS — O99213 Obesity complicating pregnancy, third trimester: Secondary | ICD-10-CM | POA: Diagnosis not present

## 2019-10-12 DIAGNOSIS — O321XX0 Maternal care for breech presentation, not applicable or unspecified: Secondary | ICD-10-CM | POA: Diagnosis not present

## 2019-10-12 DIAGNOSIS — O99210 Obesity complicating pregnancy, unspecified trimester: Secondary | ICD-10-CM

## 2019-10-12 DIAGNOSIS — Z6841 Body Mass Index (BMI) 40.0 and over, adult: Secondary | ICD-10-CM

## 2019-10-12 DIAGNOSIS — Z3A37 37 weeks gestation of pregnancy: Secondary | ICD-10-CM

## 2019-10-12 DIAGNOSIS — Z3A38 38 weeks gestation of pregnancy: Secondary | ICD-10-CM

## 2019-10-12 LAB — POCT URINALYSIS DIPSTICK OB
Bilirubin, UA: NEGATIVE
Blood, UA: NEGATIVE
Glucose, UA: NEGATIVE
Ketones, UA: NEGATIVE
Leukocytes, UA: NEGATIVE
Nitrite, UA: NEGATIVE
POC,PROTEIN,UA: NEGATIVE
Spec Grav, UA: 1.01 (ref 1.010–1.025)
Urobilinogen, UA: 0.2 E.U./dL
pH, UA: 5 (ref 5.0–8.0)

## 2019-10-12 NOTE — Patient Instructions (Signed)
Braxton Hicks Contractions °Contractions of the uterus can occur throughout pregnancy, but they are not always a sign that you are in labor. You may have practice contractions called Braxton Hicks contractions. These false labor contractions are sometimes confused with true labor. °What are Braxton Hicks contractions? °Braxton Hicks contractions are tightening movements that occur in the muscles of the uterus before labor. Unlike true labor contractions, these contractions do not result in opening (dilation) and thinning of the cervix. Toward the end of pregnancy (32-34 weeks), Braxton Hicks contractions can happen more often and may become stronger. These contractions are sometimes difficult to tell apart from true labor because they can be very uncomfortable. You should not feel embarrassed if you go to the hospital with false labor. °Sometimes, the only way to tell if you are in true labor is for your health care provider to look for changes in the cervix. The health care provider will do a physical exam and may monitor your contractions. If you are not in true labor, the exam should show that your cervix is not dilating and your water has not broken. °If there are no other health problems associated with your pregnancy, it is completely safe for you to be sent home with false labor. You may continue to have Braxton Hicks contractions until you go into true labor. °How to tell the difference between true labor and false labor °True labor °· Contractions last 30-70 seconds. °· Contractions become very regular. °· Discomfort is usually felt in the top of the uterus, and it spreads to the lower abdomen and low back. °· Contractions do not go away with walking. °· Contractions usually become more intense and increase in frequency. °· The cervix dilates and gets thinner. °False labor °· Contractions are usually shorter and not as strong as true labor contractions. °· Contractions are usually irregular. °· Contractions  are often felt in the front of the lower abdomen and in the groin. °· Contractions may go away when you walk around or change positions while lying down. °· Contractions get weaker and are shorter-lasting as time goes on. °· The cervix usually does not dilate or become thin. °Follow these instructions at home: ° °· Take over-the-counter and prescription medicines only as told by your health care provider. °· Keep up with your usual exercises and follow other instructions from your health care provider. °· Eat and drink lightly if you think you are going into labor. °· If Braxton Hicks contractions are making you uncomfortable: °? Change your position from lying down or resting to walking, or change from walking to resting. °? Sit and rest in a tub of warm water. °? Drink enough fluid to keep your urine pale yellow. Dehydration may cause these contractions. °? Do slow and deep breathing several times an hour. °· Keep all follow-up prenatal visits as told by your health care provider. This is important. °Contact a health care provider if: °· You have a fever. °· You have continuous pain in your abdomen. °Get help right away if: °· Your contractions become stronger, more regular, and closer together. °· You have fluid leaking or gushing from your vagina. °· You pass blood-tinged mucus (bloody show). °· You have bleeding from your vagina. °· You have low back pain that you never had before. °· You feel your baby’s head pushing down and causing pelvic pressure. °· Your baby is not moving inside you as much as it used to. °Summary °· Contractions that occur before labor are   called Braxton Hicks contractions, false labor, or practice contractions. °· Braxton Hicks contractions are usually shorter, weaker, farther apart, and less regular than true labor contractions. True labor contractions usually become progressively stronger and regular, and they become more frequent. °· Manage discomfort from Braxton Hicks contractions  by changing position, resting in a warm bath, drinking plenty of water, or practicing deep breathing. °This information is not intended to replace advice given to you by your health care provider. Make sure you discuss any questions you have with your health care provider. °Document Revised: 07/17/2017 Document Reviewed: 12/18/2016 °Elsevier Patient Education © 2020 Elsevier Inc. ° °

## 2019-10-12 NOTE — Progress Notes (Signed)
Body mass index is 45.06 kg/m.  ROB , U/s, NST for obesity. U/s shows breech presentation. Discussed with pt. Information given on spinning babies and moxibustion for breech presentation given. Discussed version. She will discuss with her partner and let me know if she would like to attempt version or scheduled c section.   NST INTERPRETATION:Reactive  Indications: obesity in pregnancy  Impression: reactive Baseline: 140 Variability: moderate accelerations present Decelerations absent  Plan: call once she decides on verson or c section. Return 1 wk continue anti natal testing.     Doreene Burke, CNM    Patient Name: Veronica Hurst DOB: [birth year] MRN: [id number] ULTRASOUND REPORT  Location: [facility] Date of Service: 10/12/2019   Indications:growth/afi   Findings:  Mason Jim intrauterine pregnancy is visualized with FHR at 145 BPM. Biometrics give an (U/S) Gestational age of [redacted]w[redacted]d and an (U/S) EDD of 11/02/2019; this correlates with the clinically established Estimated Date of Delivery: 10/26/19.  Fetal presentation is Breech.  Placenta: anterior. Grade: 2 AFI: 10.7 cm  Growth percentile is 23. EFW: 2850 g ( 6 lbs 5 oz)   Impression: 1. [redacted]w[redacted]d Viable Singleton Intrauterine pregnancy previously established criteria. 2. Growth is 23 %ile.  AFI is 10.7 cm.   Recommendations: 1.Clinical correlation with the patient's History and Physical Exam.   Jenine  M. Marciano Sequin     RDMS

## 2019-10-13 ENCOUNTER — Encounter: Payer: Managed Care, Other (non HMO) | Admitting: Certified Nurse Midwife

## 2019-10-17 ENCOUNTER — Observation Stay: Payer: Managed Care, Other (non HMO)

## 2019-10-17 ENCOUNTER — Other Ambulatory Visit: Payer: Self-pay

## 2019-10-17 ENCOUNTER — Observation Stay
Admission: EM | Admit: 2019-10-17 | Discharge: 2019-10-17 | Disposition: A | Discharge status: Home / Self Care | Payer: Managed Care, Other (non HMO) | Attending: Certified Nurse Midwife | Admitting: Certified Nurse Midwife

## 2019-10-17 ENCOUNTER — Telehealth: Payer: Self-pay

## 2019-10-17 ENCOUNTER — Telehealth: Payer: Self-pay | Admitting: Certified Nurse Midwife

## 2019-10-17 ENCOUNTER — Encounter: Payer: Self-pay | Admitting: Obstetrics and Gynecology

## 2019-10-17 DIAGNOSIS — Z3A38 38 weeks gestation of pregnancy: Secondary | ICD-10-CM | POA: Diagnosis not present

## 2019-10-17 DIAGNOSIS — Z3A37 37 weeks gestation of pregnancy: Secondary | ICD-10-CM | POA: Diagnosis not present

## 2019-10-17 DIAGNOSIS — O329XX0 Maternal care for malpresentation of fetus, unspecified, not applicable or unspecified: Secondary | ICD-10-CM

## 2019-10-17 DIAGNOSIS — O99213 Obesity complicating pregnancy, third trimester: Principal | ICD-10-CM | POA: Insufficient documentation

## 2019-10-17 NOTE — Telephone Encounter (Signed)
Mychart message sent to patient. Appointment is scheduled on 10/19/19 at 830 with Dr Valentino Saxon for a preop for a C/S.

## 2019-10-17 NOTE — Consult Note (Signed)
[facility] Anesthesia Consultation  Veronica Hurst WCH:[id number] DOB: [birth year] DOA: 10/17/2019 PCP: Kerman Passey, MD   Requesting physician: Doreene Burke, CNM Date of consultation: 10/17/19 Reason for consultation: Obesity during pregnancy  CHIEF COMPLAINT:  Obesity during pregnancy  HISTORY OF PRESENT ILLNESS: Veronica Hurst  is a 29 y.o. female with a known history of obesity during pregnancy. Denies hx of cardiovascular disease. Denies hx of asthma. Denies personal or family hx of bleeding disorders.   PAST MEDICAL HISTORY:   Past Medical History:  Diagnosis Date  . Allergy   . Anxiety   . Hyperlipidemia LDL goal <100 03/06/2015  . Obesity (BMI 30-39.9)   . Tachycardia 12/21/2015    PAST SURGICAL HISTORY:  Past Surgical History:  Procedure Laterality Date  . INCISION AND DRAINAGE ABSCESS N/A    abdomen  . TONSILLECTOMY      SOCIAL HISTORY:  Social History   Tobacco Use  . Smoking status: Never Smoker  . Smokeless tobacco: Never Used  Substance Use Topics  . Alcohol use: Yes    Alcohol/week: 0.0 standard drinks    Comment: occassional    FAMILY HISTORY:  Family History  Problem Relation Age of Onset  . Hyperlipidemia Mother   . Hypertension Father   . Heart disease Father        on dad's side  . Thyroid disease Father   . Cancer Maternal Grandfather   . Thyroid disease Sister   . Breast cancer Maternal Aunt   . Thyroid disease Maternal Aunt     DRUG ALLERGIES: No Known Allergies  REVIEW OF SYSTEMS:   RESPIRATORY: No cough, shortness of breath, wheezing.  CARDIOVASCULAR: No chest pain, orthopnea, edema.  HEMATOLOGY: No anemia, easy bruising or bleeding SKIN: No rash or lesion. NEUROLOGIC: No tingling, numbness, weakness.  PSYCHIATRY: No anxiety or depression.   MEDICATIONS AT HOME:  Prior to Admission medications   Medication Sig Start Date End Date Taking? Authorizing Provider  Ascorbic Acid (VITAMIN C) 100  MG tablet Take 100 mg by mouth daily.   Yes [provider]  cholecalciferol (VITAMIN D3) 25 MCG (1000 UNIT) tablet Take 1,000 Units by mouth daily.   Yes [provider]  diphenhydramine-acetaminophen (TYLENOL PM) 25-500 MG TABS tablet Take 1 tablet by mouth at bedtime as needed.   Yes [provider]  Prenat w/o A Vit-FeFum-FePo-FA (CONCEPT OB) 130-92.4-1 MG CAPS Take 130 mg by mouth daily. 06/01/19  Yes Shambley, Melody N, CNM      PHYSICAL EXAMINATION:   VITAL SIGNS: Blood pressure 118/65, pulse 88, temperature 36.9 C, temperature source Oral, resp. rate 16, last menstrual period 01/19/2019.  GENERAL:  29 y.o.-year-old patient no acute distress.  HEENT: Head atraumatic, normocephalic. Oropharynx and nasopharynx clear. MP 2, TM distance >3 cm, normal mouth opening. LUNGS: Normal breath sounds bilaterally, no wheezing, rales,rhonchi. No use of accessory muscles of respiration.  CARDIOVASCULAR: S1, S2 normal. No murmurs, rubs, or gallops.  EXTREMITIES: No pedal edema, cyanosis, or clubbing.  NEUROLOGIC: normal gait PSYCHIATRIC: The patient is alert and oriented x 3.  SKIN: No obvious rash, lesion, or ulcer.    IMPRESSION AND PLAN:   Veronica Hurst  is a 29 y.o. female presenting with obesity during pregnancy. BMI is currently 45 at [redacted] weeks gestation. Last check baby was breech. Planning for [place] at next [facility] appointment 10/20/19 and delivery planning based on positioning at that time.   Reassuring airway exam. Spinal interspaces palpable.   We discussed  plan for spinal for scheduled cesarean. We discussed analgesic options during labor including epidural analgesia. Discussed that in obesity there can be increased difficulty with epidural placement or even failure of successful epidural. We also discussed that even after successful epidural placement there is increased risk of catheter migration out of the epidural space that would require catheter replacement.  Discussed use of epidural vs spinal vs GA if cesarean delivery is required. Discussed increased risk of difficult intubation during pregnancy should an emergency cesarean delivery be required.   Plan for delivery at [facility].

## 2019-10-17 NOTE — Progress Notes (Signed)
RN spoke with CNM's nurse, who approved pt. discharge per CNM and said for pt. to call office today to change appt. d/t need to schedule c-section. RN educated pt. and SO on c-section procedures in-depth, as well as reasons to return to ED/Labor & Delivery, including LOF, VB, regular ctx's, decreased FM, etc. RN relayed information regarding the need to change appt. this week from one with a CNM to one with a MD d/t inevitable c-section. Pt. verbalized understanding.  Will discharge home.

## 2019-10-17 NOTE — OB Triage Note (Signed)
    L&D OB Triage Note  SUBJECTIVE Veronica Hurst is a 29 y.o. G1P0000 female at [redacted]w[redacted]d, EDD Estimated Date of Delivery: 10/26/19 who presented to triage with complaints of some cramping that occurred early this morning that is has now resolved. She is concerned because she was breech at her last u/s.    OB History  Gravida Para Term Preterm AB Living  1 0 0 0 0 0  SAB TAB Ectopic Multiple Live Births  0 0 0 0 0    # Outcome Date GA Lbr Len/2nd Weight Sex Delivery Anes PTL Lv  1 Current             Medications Prior to Admission  Medication Sig Dispense Refill Last Dose  . Ascorbic Acid (VITAMIN C) 100 MG tablet Take 100 mg by mouth daily.   Past Week at Unknown time  . cholecalciferol (VITAMIN D3) 25 MCG (1000 UNIT) tablet Take 1,000 Units by mouth daily.   Past Week at Unknown time  . diphenhydramine-acetaminophen (TYLENOL PM) 25-500 MG TABS tablet Take 1 tablet by mouth at bedtime as needed.   10/16/2019 at Unknown time  . Prenat w/o A Vit-FeFum-FePo-FA (CONCEPT OB) 130-92.4-1 MG CAPS Take 130 mg by mouth daily. 30 capsule 11 10/16/2019 at Unknown time     OBJECTIVE  Nursing Evaluation:   BP 118/65 (BP Location: Left Arm)   Pulse 88   Temp 98.4 F (36.9 C) (Oral)   Resp 16   LMP 01/19/2019 (Exact Date)    Findings:        Irritability       NST was performed and has been reviewed by me.  NST INTERPRETATION: Category I  Mode: External Baseline Rate (A): 130 bpm Variability: Moderate Accelerations: 15 x 15 Decelerations: None     Contraction Frequency (min): 2-4 w/UI  ASSESSMENT Impression:  1.  Pregnancy:  G1P0000 at [redacted]w[redacted]d , EDD Estimated Date of Delivery: 10/26/19 2.  Reassuring fetal and maternal status 3.  Mild irritability 4. U/s -breech presentation   PLAN 1. Discussed current condition and above findings with patient and reassurance given.  All questions answered. 2. Discharge home with standard labor precautions given to return to L&D or call the office  for problems. 3. Continue routine prenatal care. Follow up with MD this week for pre-op appointment.   Doreene Burke, CNM

## 2019-10-17 NOTE — OB Triage Note (Signed)
Pt. reports to Labor & Delivery with complaints of mild lower abdominal cramping since 0730 today that has since subsided. She denies vaginal bleeding and LOF, and states that baby has been moving well. She states that she also came to the hospital because she would really like to have an ultrasound, as she recently found out last week that her baby is breech. She states she would like to know the baby's position in the event she were to go into labor. External FM and Toco applied. Vital signs WNL. RN told pt. she would relay her concerns to CNM on call.

## 2019-10-17 NOTE — Telephone Encounter (Signed)
Patient was directed to the ED for evaluation and possible ultrasound. [facility] did not have a sonographer in house today.

## 2019-10-17 NOTE — Telephone Encounter (Signed)
Pt called in and stated that she is cramping and she feels uncomfortable. She is worried because her baby is breached. She is having cramps every 10 mins. The pt isnt bleeding. The pt is requesting a call back. Please advise

## 2019-10-19 ENCOUNTER — Other Ambulatory Visit
Admission: RE | Admit: 2019-10-19 | Discharge: 2019-10-19 | Disposition: A | Discharge status: Home / Self Care | Payer: Managed Care, Other (non HMO) | Source: Ambulatory Visit | Attending: Obstetrics and Gynecology | Admitting: Obstetrics and Gynecology

## 2019-10-19 ENCOUNTER — Encounter: Payer: Managed Care, Other (non HMO) | Admitting: Obstetrics and Gynecology

## 2019-10-19 ENCOUNTER — Ambulatory Visit (INDEPENDENT_AMBULATORY_CARE_PROVIDER_SITE_OTHER): Payer: Managed Care, Other (non HMO) | Admitting: Obstetrics and Gynecology

## 2019-10-19 ENCOUNTER — Encounter: Payer: Self-pay | Admitting: Obstetrics and Gynecology

## 2019-10-19 ENCOUNTER — Other Ambulatory Visit: Payer: Self-pay

## 2019-10-19 VITALS — BP 132/84 | HR 94 | Wt 246.6 lb

## 2019-10-19 DIAGNOSIS — Z3A39 39 weeks gestation of pregnancy: Secondary | ICD-10-CM

## 2019-10-19 DIAGNOSIS — Z3493 Encounter for supervision of normal pregnancy, unspecified, third trimester: Secondary | ICD-10-CM

## 2019-10-19 LAB — POCT URINALYSIS DIPSTICK OB
Bilirubin, UA: NEGATIVE
Blood, UA: NEGATIVE
Glucose, UA: NEGATIVE
Ketones, UA: NEGATIVE
Leukocytes, UA: NEGATIVE
Nitrite, UA: NEGATIVE
Spec Grav, UA: 1.015 (ref 1.010–1.025)
Urobilinogen, UA: 0.2 E.U./dL
pH, UA: 6.5 (ref 5.0–8.0)

## 2019-10-19 LAB — SARS CORONAVIRUS 2 (TAT 6-24 HRS): SARS Coronavirus 2: NEGATIVE

## 2019-10-19 NOTE — Progress Notes (Signed)
40 wPt present for pre-op for c-section. Pt stated having abd pain and a rash on the outside of her vaginal and between her thighs.

## 2019-10-19 NOTE — Progress Notes (Signed)
ROB: Patient presents from midwifery service due to findings of fetal malpresentation (breech) at term.  Patient has tried [employer], declined ECV. Presents for scheduling of C-section. Discussed risks/benefits and nature of procedure. Pre-op scheduled patient for 10/21/2019. To get COVID testing today.     Imaging (10/17/2019):  CLINICAL DATA:  Pregnant patient.  Evaluate fetal presentation.  EXAM: LIMITED OBSTETRIC ULTRASOUND  FINDINGS: Number of Fetuses: 1  Heart Rate:  135 bpm  Movement: Visualized.  Presentation: Breech.  Placental Location: Anterior.  Previa: No.  Amniotic Fluid (Subjective):  Within normal limits.  AFI: 8.3 cm  BPD: 9.5  cm 38 w  5 d  MATERNAL FINDINGS:  Cervix:  Appears closed.  Uterus/Adnexae: No abnormality visualized.  IMPRESSION: Single living intrauterine pregnancy.  Breech presentation.  This exam is performed on an emergent basis and does not comprehensively evaluate fetal size, dating, or anatomy; follow-up complete OB US should be considered if further fetal assessment is warranted.   Electronically Signed   By: Drusilla Kanner M.D.   On: 10/17/2019 14:17

## 2019-10-19 NOTE — Patient Instructions (Signed)
COVID 19 Instructions for Scheduled Procedure (Inductions/C-sections and GYN surgeries)   Thank you for choosing [facility] for your services.  You have been scheduled for a procedure called _______Primary Cesarean Section_________________________.    Your procedure is scheduled on __________Friday, March 5th, 2021__________________.  You are required to have COVID-19 testing performed 2 days prior to your scheduled procedure date.  Testing is performed between 9 AM and 4 PM Monday through Friday.  Please present for testing on ___today_______________ during this hour. Drive up testing is performed in front of the [facility] (this is next to the [employer] [facility]).    Upon your scheduled procedure date, you will need to arrive at the [facility] entrance. (There is a statue at the front of this entrance.)   Please arrive on time if you are scheduled for an induction of labor.   If you are scheduled for a Cesarean delivery or for Gyn Surgery, arrive 2 hours prior to your procedure time.   If you are an Obstetric patient and your arrival time falls between 11 PM and 6 AM call L&D [phone]) when you arrive.  A staff member will meet you at the [facility] entrance.  At this time, patients are allowed 1 support person to accompany them. Face masks are required for you and your support person. Your support person is now allowed to be there with you during the entire time of your admission.   Please contact the office if you have any questions regarding this information.  The [facility] office number is [phone].      Cesarean Delivery Cesarean birth, or cesarean delivery, is the surgical delivery of a baby through an incision in the abdomen and the uterus. This may be referred to as a C-section. This procedure may be scheduled ahead of time, or it may be done in an emergency situation. Tell a health care provider about:  Any allergies you have.  All  medicines you are taking, including vitamins, herbs, eye drops, creams, and over-the-counter medicines.  Any problems you or family members have had with anesthetic medicines.  Any blood disorders you have.  Any surgeries you have had.  Any medical conditions you have.  Whether you or any members of your family have a history of deep vein thrombosis (DVT) or pulmonary embolism (PE). What are the risks? Generally, this is a safe procedure. However, problems may occur, including:  Infection.  Bleeding.  Allergic reactions to medicines.  Damage to other structures or organs.  Blood clots.  Injury to your baby. What happens before the procedure? General instructions  Follow instructions from your health care provider about eating or drinking restrictions.  If you know that you are going to have a cesarean delivery, do not shave your pubic area. Shaving before the procedure may increase your risk of infection.  Plan to have someone take you home from the hospital.  Ask your health care provider what steps will be taken to prevent infection. These may include: ? Removing hair at the surgery site. ? Washing skin with a germ-killing soap. ? Taking antibiotic medicine.  Depending on the reason for your cesarean delivery, you may have a physical exam or additional testing, such as an ultrasound.  You may have your blood or urine tested. Questions for your health care provider  Ask your health care provider about: ? Changing or stopping your regular medicines. This is especially important if you are taking diabetes medicines or  blood thinners. ? Your pain management plan. This is especially important if you plan to breastfeed your baby. ? How long you will be in the hospital after the procedure. ? Any concerns you may have about receiving blood products, if you need them during the procedure. ? Cord blood banking, if you plan to collect your baby's umbilical cord blood.  You  may also want to ask your health care provider: ? Whether you will be able to hold or breastfeed your baby while you are still in the operating room. ? Whether your baby can stay with you immediately after the procedure and during your recovery. ? Whether a family member or a person of your choice can go with you into the operating room and stay with you during the procedure, immediately after the procedure, and during your recovery. What happens during the procedure?   An IV will be inserted into one of your veins.  Fluid and medicines, such as antibiotics, will be given before the surgery.  Fetal monitors will be placed on your abdomen to check your baby's heart rate.  You may be given a special warming gown to wear to keep your temperature stable.  A catheter may be inserted into your bladder through your urethra. This drains your urine during the procedure.  You may be given one or more of the following: ? A medicine to numb the area (local anesthetic). ? A medicine to make you fall asleep (general anesthetic). ? A medicine (regional anesthetic) that is injected into your back or through a small thin tube placed in your back (spinal anesthetic or epidural anesthetic). This numbs everything below the injection site and allows you to stay awake during your procedure. If this makes you feel nauseous, tell your health care provider. Medicines will be available to help reduce any nausea you may feel.  An incision will be made in your abdomen, and then in your uterus.  If you are awake during your procedure, you may feel tugging and pulling in your abdomen, but you should not feel pain. If you feel pain, tell your health care provider immediately.  Your baby will be removed from your uterus. You may feel more pressure or pushing while this happens.  Immediately after birth, your baby will be dried and kept warm. You may be able to hold and breastfeed your baby.  The umbilical cord may be  clamped and cut during this time. This usually occurs after waiting a period of 1-2 minutes after delivery.  Your placenta will be removed from your uterus.  Your incisions will be closed with stitches (sutures). Staples, skin glue, or adhesive strips may also be applied to the incision in your abdomen.  Bandages (dressings) may be placed over the incision in your abdomen. The procedure may vary among health care providers and hospitals. What happens after the procedure?  Your blood pressure, heart rate, breathing rate, and blood oxygen level will be monitored until you are discharged from the hospital.  You may continue to receive fluids and medicines through an IV.  You will have some pain. Medicines will be available to help control your pain.  To help prevent blood clots: ? You may be given medicines. ? You may have to wear compression stockings or devices. ? You will be encouraged to walk around when you are able.  Hospital staff will encourage and support bonding with your baby. Your hospital may have you and your baby to stay in the same room (  rooming in) during your hospital stay to encourage successful bonding and breastfeeding.  You may be encouraged to cough and breathe deeply often. This helps to prevent lung problems.  If you have a catheter draining your urine, it will be removed as soon as possible after your procedure. Summary  Cesarean birth, or cesarean delivery, is the surgical delivery of a baby through an incision in the abdomen and the uterus.  Follow instructions from your health care provider about eating or drinking restrictions before the procedure.  You will have some pain after the procedure. Medicines will be available to help control your pain.  Hospital staff will encourage and support bonding with your baby after the procedure. Your hospital may have you and your baby to stay in the same room (rooming in) during your hospital stay to encourage  successful bonding and breastfeeding. This information is not intended to replace advice given to you by your health care provider. Make sure you discuss any questions you have with your health care provider. Document Revised: 02/08/2018 Document Reviewed: 02/08/2018 Elsevier Patient Education  2020 [employer].   Thank you,    Your [facility] Providers

## 2019-10-20 ENCOUNTER — Encounter: Payer: Managed Care, Other (non HMO) | Admitting: Certified Nurse Midwife

## 2019-10-20 ENCOUNTER — Other Ambulatory Visit: Payer: Managed Care, Other (non HMO)

## 2019-10-20 ENCOUNTER — Other Ambulatory Visit: Payer: Self-pay

## 2019-10-20 ENCOUNTER — Other Ambulatory Visit
Admission: RE | Admit: 2019-10-20 | Discharge: 2019-10-20 | Disposition: A | Discharge status: Home / Self Care | Payer: Managed Care, Other (non HMO) | Source: Ambulatory Visit | Attending: Obstetrics and Gynecology | Admitting: Obstetrics and Gynecology

## 2019-10-20 LAB — CBC
HCT: 38 % (ref 36.0–46.0)
Hemoglobin: 12.4 g/dL (ref 12.0–15.0)
MCH: 29.2 pg (ref 26.0–34.0)
MCHC: 32.6 g/dL (ref 30.0–36.0)
MCV: 89.4 fL (ref 80.0–100.0)
Platelets: 169 10*3/uL (ref 150–400)
RBC: 4.25 MIL/uL (ref 3.87–5.11)
RDW: 13.4 % (ref 11.5–15.5)
WBC: 9.6 10*3/uL (ref 4.0–10.5)
nRBC: 0 % (ref 0.0–0.2)

## 2019-10-20 LAB — TYPE AND SCREEN
ABO/RH(D): O POS
Antibody Screen: NEGATIVE
Extend sample reason: UNDETERMINED

## 2019-10-20 MED ORDER — DEXTROSE 5 % IV SOLN
3.0000 g | INTRAVENOUS | Status: DC
  Filled 2019-10-20 (×2): qty 3000

## 2019-10-21 ENCOUNTER — Encounter: Payer: Self-pay | Admitting: Obstetrics and Gynecology

## 2019-10-21 ENCOUNTER — Inpatient Hospital Stay
Admission: RE | Admit: 2019-10-21 | Discharge: 2019-10-23 | DRG: 787 | Disposition: A | Discharge status: Home / Self Care | Payer: Managed Care, Other (non HMO) | Attending: Obstetrics and Gynecology | Admitting: Obstetrics and Gynecology

## 2019-10-21 ENCOUNTER — Encounter
Admission: RE | Disposition: A | Discharge status: Home / Self Care | Payer: Self-pay | Source: Home / Self Care | Attending: Obstetrics and Gynecology

## 2019-10-21 ENCOUNTER — Inpatient Hospital Stay: Payer: Managed Care, Other (non HMO) | Admitting: Anesthesiology

## 2019-10-21 ENCOUNTER — Other Ambulatory Visit: Payer: Self-pay

## 2019-10-21 ENCOUNTER — Inpatient Hospital Stay
Admission: RE | Admit: 2019-10-21 | Payer: Managed Care, Other (non HMO) | Source: Home / Self Care | Admitting: Obstetrics and Gynecology

## 2019-10-21 DIAGNOSIS — D62 Acute posthemorrhagic anemia: Secondary | ICD-10-CM | POA: Diagnosis not present

## 2019-10-21 DIAGNOSIS — O321XX0 Maternal care for breech presentation, not applicable or unspecified: Principal | ICD-10-CM | POA: Diagnosis present

## 2019-10-21 DIAGNOSIS — Z20822 Contact with and (suspected) exposure to covid-19: Secondary | ICD-10-CM | POA: Diagnosis present

## 2019-10-21 DIAGNOSIS — O329XX0 Maternal care for malpresentation of fetus, unspecified, not applicable or unspecified: Secondary | ICD-10-CM

## 2019-10-21 DIAGNOSIS — O9081 Anemia of the puerperium: Secondary | ICD-10-CM | POA: Diagnosis not present

## 2019-10-21 DIAGNOSIS — O99214 Obesity complicating childbirth: Secondary | ICD-10-CM | POA: Diagnosis present

## 2019-10-21 DIAGNOSIS — Z3A39 39 weeks gestation of pregnancy: Secondary | ICD-10-CM | POA: Diagnosis not present

## 2019-10-21 DIAGNOSIS — E669 Obesity, unspecified: Secondary | ICD-10-CM | POA: Diagnosis present

## 2019-10-21 DIAGNOSIS — Z98891 History of uterine scar from previous surgery: Secondary | ICD-10-CM

## 2019-10-21 LAB — ABO/RH: ABO/RH(D): O POS

## 2019-10-21 LAB — RPR: RPR Ser Ql: NONREACTIVE

## 2019-10-21 SURGERY — Surgical Case
Anesthesia: Spinal

## 2019-10-21 MED ORDER — DEXTROSE 5 % IV SOLN
INTRAVENOUS | Status: DC | PRN
  Administered 2019-10-21: 3 g via INTRAVENOUS

## 2019-10-21 MED ORDER — PHENYLEPHRINE HCL (PRESSORS) 10 MG/ML IV SOLN
INTRAVENOUS | Status: DC | PRN
  Administered 2019-10-21: 100 ug via INTRAVENOUS

## 2019-10-21 MED ORDER — KETOROLAC TROMETHAMINE 30 MG/ML IJ SOLN
INTRAMUSCULAR | Status: DC | PRN
  Administered 2019-10-21: 30 mg via INTRAVENOUS

## 2019-10-21 MED ORDER — SIMETHICONE 80 MG PO CHEW
80.0000 mg | CHEWABLE_TABLET | ORAL | Status: DC | PRN
  Filled 2019-10-21: qty 1

## 2019-10-21 MED ORDER — ACETAMINOPHEN 500 MG PO TABS
1000.0000 mg | ORAL_TABLET | ORAL | Status: AC
  Administered 2019-10-21: 1000 mg via ORAL
  Filled 2019-10-21: qty 2

## 2019-10-21 MED ORDER — TRAMADOL HCL 50 MG PO TABS: 50.0000 mg | ORAL_TABLET | Freq: Four times a day (QID) | ORAL | Status: DC | PRN

## 2019-10-21 MED ORDER — SOD CITRATE-CITRIC ACID 500-334 MG/5ML PO SOLN
30.0000 mL | ORAL | Status: AC
  Administered 2019-10-21: 30 mL via ORAL
  Filled 2019-10-21: qty 30

## 2019-10-21 MED ORDER — SODIUM CHLORIDE 0.9 % IV SOLN
INTRAVENOUS | Status: DC | PRN
  Administered 2019-10-21: 50 ug/min via INTRAVENOUS

## 2019-10-21 MED ORDER — HYDROCODONE-ACETAMINOPHEN 5-325 MG PO TABS: 1.0000 | ORAL_TABLET | Freq: Four times a day (QID) | ORAL | Status: AC | PRN

## 2019-10-21 MED ORDER — GABAPENTIN 300 MG PO CAPS
300.0000 mg | ORAL_CAPSULE | Freq: Two times a day (BID) | ORAL | Status: DC
  Administered 2019-10-21 – 2019-10-22 (×3): 300 mg via ORAL
  Filled 2019-10-21 (×3): qty 1

## 2019-10-21 MED ORDER — LACTATED RINGERS IV SOLN: INTRAVENOUS | Status: DC

## 2019-10-21 MED ORDER — KETOROLAC TROMETHAMINE 30 MG/ML IJ SOLN
30.0000 mg | Freq: Four times a day (QID) | INTRAMUSCULAR | Status: AC
  Administered 2019-10-21 – 2019-10-22 (×4): 30 mg via INTRAVENOUS
  Filled 2019-10-21 (×4): qty 1

## 2019-10-21 MED ORDER — MAGNESIUM HYDROXIDE 400 MG/5ML PO SUSP: 30.0000 mL | ORAL | Status: DC | PRN

## 2019-10-21 MED ORDER — FENTANYL CITRATE (PF) 100 MCG/2ML IJ SOLN: 25.0000 ug | INTRAMUSCULAR | Status: DC | PRN

## 2019-10-21 MED ORDER — OXYCODONE HCL 5 MG PO TABS: 5.0000 mg | ORAL_TABLET | ORAL | Status: DC | PRN

## 2019-10-21 MED ORDER — DIBUCAINE (PERIANAL) 1 % EX OINT: 1.0000 "application " | TOPICAL_OINTMENT | CUTANEOUS | Status: DC | PRN

## 2019-10-21 MED ORDER — FERROUS SULFATE 325 (65 FE) MG PO TABS
325.0000 mg | ORAL_TABLET | Freq: Two times a day (BID) | ORAL | Status: DC
  Administered 2019-10-21 – 2019-10-23 (×4): 325 mg via ORAL
  Filled 2019-10-21 (×4): qty 1

## 2019-10-21 MED ORDER — BUPIVACAINE LIPOSOME 1.3 % IJ SUSP
INTRAMUSCULAR | Status: AC
  Filled 2019-10-21: qty 20

## 2019-10-21 MED ORDER — OXYTOCIN 40 UNITS IN NORMAL SALINE INFUSION - SIMPLE MED
INTRAVENOUS | Status: AC
  Filled 2019-10-21: qty 1000

## 2019-10-21 MED ORDER — WITCH HAZEL-GLYCERIN EX PADS: 1.0000 "application " | MEDICATED_PAD | CUTANEOUS | Status: DC | PRN

## 2019-10-21 MED ORDER — IBUPROFEN 800 MG PO TABS
800.0000 mg | ORAL_TABLET | Freq: Four times a day (QID) | ORAL | Status: DC
  Administered 2019-10-22 – 2019-10-23 (×2): 800 mg via ORAL
  Filled 2019-10-21 (×2): qty 1

## 2019-10-21 MED ORDER — OXYCODONE-ACETAMINOPHEN 5-325 MG PO TABS: 2.0000 | ORAL_TABLET | ORAL | Status: DC | PRN

## 2019-10-21 MED ORDER — MORPHINE SULFATE (PF) 0.5 MG/ML IJ SOLN
INTRAMUSCULAR | Status: AC
  Filled 2019-10-21: qty 10

## 2019-10-21 MED ORDER — NALBUPHINE HCL 10 MG/ML IJ SOLN: 2.5000 mg | Freq: Four times a day (QID) | INTRAMUSCULAR | Status: DC | PRN

## 2019-10-21 MED ORDER — HYDROMORPHONE HCL 1 MG/ML IJ SOLN: 1.0000 mg | INTRAMUSCULAR | Status: DC | PRN

## 2019-10-21 MED ORDER — BUPIVACAINE HCL 0.5 % IJ SOLN
INTRAMUSCULAR | Status: DC | PRN
  Administered 2019-10-21: 30 mL

## 2019-10-21 MED ORDER — SODIUM CHLORIDE 0.9% FLUSH
INTRAVENOUS | Status: DC | PRN
  Administered 2019-10-21: 50 mL via INTRADERMAL

## 2019-10-21 MED ORDER — BUPIVACAINE LIPOSOME 1.3 % IJ SUSP
INTRAMUSCULAR | Status: DC | PRN
  Administered 2019-10-21: 20 mL

## 2019-10-21 MED ORDER — COCONUT OIL OIL
1.0000 "application " | TOPICAL_OIL | Status: DC | PRN
  Filled 2019-10-21: qty 120

## 2019-10-21 MED ORDER — KETOROLAC TROMETHAMINE 30 MG/ML IJ SOLN: 30.0000 mg | Freq: Once | INTRAMUSCULAR | Status: DC

## 2019-10-21 MED ORDER — GABAPENTIN 300 MG PO CAPS
300.0000 mg | ORAL_CAPSULE | ORAL | Status: AC
  Administered 2019-10-21: 300 mg via ORAL
  Filled 2019-10-21: qty 1

## 2019-10-21 MED ORDER — SENNOSIDES-DOCUSATE SODIUM 8.6-50 MG PO TABS
2.0000 | ORAL_TABLET | ORAL | Status: DC
  Administered 2019-10-21 – 2019-10-22 (×2): 2 via ORAL
  Filled 2019-10-21 (×2): qty 2

## 2019-10-21 MED ORDER — SODIUM CHLORIDE (PF) 0.9 % IJ SOLN
INTRAMUSCULAR | Status: AC
  Filled 2019-10-21: qty 50

## 2019-10-21 MED ORDER — MORPHINE SULFATE (PF) 2 MG/ML IV SOLN: 1.0000 mg | INTRAVENOUS | Status: AC | PRN

## 2019-10-21 MED ORDER — DIPHENHYDRAMINE HCL 25 MG PO CAPS: 25.0000 mg | ORAL_CAPSULE | Freq: Four times a day (QID) | ORAL | Status: DC | PRN

## 2019-10-21 MED ORDER — PROPOFOL 10 MG/ML IV BOLUS
INTRAVENOUS | Status: AC
  Filled 2019-10-21: qty 20

## 2019-10-21 MED ORDER — ACETAMINOPHEN 325 MG PO TABS
650.0000 mg | ORAL_TABLET | Freq: Four times a day (QID) | ORAL | Status: DC | PRN
  Administered 2019-10-21 – 2019-10-23 (×6): 650 mg via ORAL
  Filled 2019-10-21 (×6): qty 2

## 2019-10-21 MED ORDER — KETOROLAC TROMETHAMINE 30 MG/ML IJ SOLN
15.0000 mg | Freq: Three times a day (TID) | INTRAMUSCULAR | Status: DC | PRN
  Administered 2019-10-21: 15 mg via INTRAVENOUS
  Filled 2019-10-21: qty 1

## 2019-10-21 MED ORDER — ONDANSETRON HCL 4 MG/2ML IJ SOLN
INTRAMUSCULAR | Status: DC | PRN
  Administered 2019-10-21: 4 mg via INTRAVENOUS

## 2019-10-21 MED ORDER — FENTANYL CITRATE (PF) 100 MCG/2ML IJ SOLN
INTRAMUSCULAR | Status: DC | PRN
  Administered 2019-10-21: 15 ug via INTRAVENOUS

## 2019-10-21 MED ORDER — MENTHOL 3 MG MT LOZG
1.0000 | LOZENGE | OROMUCOSAL | Status: DC | PRN
  Filled 2019-10-21: qty 9

## 2019-10-21 MED ORDER — ZOLPIDEM TARTRATE 5 MG PO TABS: 5.0000 mg | ORAL_TABLET | Freq: Every evening | ORAL | Status: DC | PRN

## 2019-10-21 MED ORDER — MORPHINE SULFATE (PF) 0.5 MG/ML IJ SOLN
INTRAMUSCULAR | Status: DC | PRN
  Administered 2019-10-21: .1 mg via EPIDURAL

## 2019-10-21 MED ORDER — SIMETHICONE 80 MG PO CHEW
80.0000 mg | CHEWABLE_TABLET | ORAL | Status: DC
  Administered 2019-10-21 – 2019-10-22 (×2): 80 mg via ORAL
  Filled 2019-10-21 (×2): qty 1

## 2019-10-21 MED ORDER — LIDOCAINE HCL (PF) 1 % IJ SOLN
INTRAMUSCULAR | Status: DC | PRN
  Administered 2019-10-21: 1 mL via SUBCUTANEOUS

## 2019-10-21 MED ORDER — BUPIVACAINE LIPOSOME 1.3 % IJ SUSP: Freq: Once | INTRAMUSCULAR | Status: DC

## 2019-10-21 MED ORDER — LIDOCAINE 5 % EX PTCH
MEDICATED_PATCH | CUTANEOUS | Status: AC
  Filled 2019-10-21: qty 1

## 2019-10-21 MED ORDER — BUPIVACAINE IN DEXTROSE 0.75-8.25 % IT SOLN
INTRATHECAL | Status: DC | PRN
  Administered 2019-10-21: 1.5 mL via INTRATHECAL

## 2019-10-21 MED ORDER — PRENATAL MULTIVITAMIN CH
1.0000 | ORAL_TABLET | Freq: Every day | ORAL | Status: DC
  Administered 2019-10-22: 1 via ORAL
  Filled 2019-10-21: qty 1

## 2019-10-21 MED ORDER — OXYTOCIN 40 UNITS IN NORMAL SALINE INFUSION - SIMPLE MED
2.5000 [IU]/h | INTRAVENOUS | Status: AC
  Administered 2019-10-21: 2.5 [IU]/h via INTRAVENOUS
  Filled 2019-10-21: qty 1000

## 2019-10-21 MED ORDER — FENTANYL CITRATE (PF) 100 MCG/2ML IJ SOLN
INTRAMUSCULAR | Status: AC
  Filled 2019-10-21: qty 2

## 2019-10-21 MED ORDER — TETRACAINE HCL 1 % IJ SOLN
INTRAMUSCULAR | Status: DC | PRN
  Administered 2019-10-21: 1 mg via INTRASPINAL

## 2019-10-21 MED ORDER — BUPIVACAINE HCL (PF) 0.5 % IJ SOLN
INTRAMUSCULAR | Status: AC
  Filled 2019-10-21: qty 30

## 2019-10-21 MED ORDER — ONDANSETRON HCL 4 MG/2ML IJ SOLN: 4.0000 mg | Freq: Once | INTRAMUSCULAR | Status: DC | PRN

## 2019-10-21 MED ORDER — EPHEDRINE SULFATE 50 MG/ML IJ SOLN
INTRAMUSCULAR | Status: DC | PRN
  Administered 2019-10-21: 10 mg via INTRAVENOUS
  Administered 2019-10-21: 30 mg via INTRAVENOUS
  Administered 2019-10-21: 5 mg via INTRAVENOUS
  Administered 2019-10-21: 10 mg via INTRAVENOUS

## 2019-10-21 MED ORDER — KETOROLAC TROMETHAMINE 30 MG/ML IJ SOLN
INTRAMUSCULAR | Status: AC
  Filled 2019-10-21: qty 1

## 2019-10-21 MED ORDER — OXYTOCIN 40 UNITS IN NORMAL SALINE INFUSION - SIMPLE MED
INTRAVENOUS | Status: DC | PRN
  Administered 2019-10-21: 400 mL/h via INTRAVENOUS

## 2019-10-21 SURGICAL SUPPLY — 27 items
BAG COUNTER SPONGE EZ (MISCELLANEOUS) ×2 IMPLANT
CANISTER SUCT 3000ML PPV (MISCELLANEOUS) ×3 IMPLANT
CHLORAPREP W/TINT 26 (MISCELLANEOUS) ×6 IMPLANT
COUNTER SPONGE BAG EZ (MISCELLANEOUS) ×1
COVER WAND RF STERILE (DRAPES) ×3 IMPLANT
DRSG TELFA 3X8 NADH (GAUZE/BANDAGES/DRESSINGS) ×3 IMPLANT
ELECT REM PT RETURN 9FT ADLT (ELECTROSURGICAL) ×3
ELECTRODE REM PT RTRN 9FT ADLT (ELECTROSURGICAL) ×1 IMPLANT
EXTRT SYSTEM ALEXIS 17CM (MISCELLANEOUS) ×3
GAUZE SPONGE 4X4 12PLY STRL (GAUZE/BANDAGES/DRESSINGS) ×3 IMPLANT
GLOVE BIO SURGEON STRL SZ 6.5 (GLOVE) ×2 IMPLANT
GLOVE BIO SURGEONS STRL SZ 6.5 (GLOVE) ×1
GLOVE INDICATOR 7.0 STRL GRN (GLOVE) ×3 IMPLANT
GOWN STRL REUS W/ TWL LRG LVL3 (GOWN DISPOSABLE) ×2 IMPLANT
GOWN STRL REUS W/TWL LRG LVL3 (GOWN DISPOSABLE) ×4
KIT TURNOVER KIT A (KITS) ×3 IMPLANT
NS IRRIG 1000ML POUR BTL (IV SOLUTION) ×3 IMPLANT
PACK C SECTION AR (MISCELLANEOUS) ×3 IMPLANT
PAD OB MATERNITY 4.3X12.25 (PERSONAL CARE ITEMS) ×3 IMPLANT
PAD PREP 24X41 OB/GYN DISP (PERSONAL CARE ITEMS) ×3 IMPLANT
PENCIL SMOKE ULTRAEVAC 22 CON (MISCELLANEOUS) ×3 IMPLANT
SUT MNCRL AB 4-0 PS2 18 (SUTURE) ×3 IMPLANT
SUT PLAIN 2 0 XLH (SUTURE) IMPLANT
SUT VIC AB 0 CT1 36 (SUTURE) ×12 IMPLANT
SUT VIC AB 3-0 SH 27 (SUTURE) ×2
SUT VIC AB 3-0 SH 27X BRD (SUTURE) ×1 IMPLANT
SYSTEM CONTND EXTRCTN KII BLLN (MISCELLANEOUS) ×1 IMPLANT

## 2019-10-21 NOTE — Lactation Note (Signed)
This note was copied from a baby's chart. Lactation Consultation Note  Patient Name: Veronica Hurst OVZCH'Y Date: 10/21/2019 Reason for consult: Follow-up assessment;Primapara   Maternal Data Formula Feeding for Exclusion: No Mom has full breasts sl. Full areola, prepump to soften areola and lengthen nipple Feeding Feeding Type: Breast Fed Baby showing cues, sucking on fingers,Pre pump to elongate nipple to allow easier latch, baby can latch if breast sandwiched and held in mouth, mom in semi side lying position with baby held beside her, baby will suck in bursts then let breast go, pushes nipple out with tongue, 1cc  Colostrum pre-pumped before feeding given by tb syringe while baby sucking on gloved LC finger  LATCH Score Latch: Repeated attempts needed to sustain latch, nipple held in mouth throughout feeding, stimulation needed to elicit sucking reflex.  Audible Swallowing: A few with stimulation  Type of Nipple: Everted at rest and after stimulation  Comfort (Breast/Nipple): Soft / non-tender  Hold (Positioning): Full assist, staff holds infant at breast  LATCH Score: 6  Interventions Interventions: Assisted with latch;Skin to skin;Pre-pump if needed;Breast compression;Adjust position;Support pillows;DEBP  Lactation Tools Discussed/Used Tools: 47F feeding tube / Syringe Pump Review: Setup, frequency, and cleaning;Milk Storage Initiated by:: Cay Schillings RNC IBCLC Date initiated:: 10/21/19 Attempt with cues tonight, if poor feeding or no latch pump breasts x 15 min and feed pumped EBM, ask for assist from assigned RN   Consult Status Consult Status: Follow-up Date: 10/22/19 Follow-up type: In-patient    Dyann Kief 10/21/2019, 5:49 PM

## 2019-10-21 NOTE — Lactation Note (Signed)
This note was copied from a baby's chart. Lactation Consultation Note  Patient Name: Veronica Hurst [id number] Date: 10/21/2019 Reason for consult: Initial assessment;Primapara;1st time breastfeeding;Term   Maternal Data Formula Feeding for Exclusion: No Has patient been taught Hand Expression?: Yes Does the patient have breastfeeding experience prior to this delivery?: No Areola firm around nipple, able to hand express drops Feeding Feeding Type: Breast Fed Baby sucks on tongue and pushes nipple out with tongue, able to latch with pressure on chin for short periods, attempted in cradle and football holds LATCH Score Latch: Repeated attempts needed to sustain latch, nipple held in mouth throughout feeding, stimulation needed to elicit sucking reflex.  Audible Swallowing: None  Type of Nipple: Everted at rest and after stimulation(thick areola)  Comfort (Breast/Nipple): Soft / non-tender  Hold (Positioning): Full assist, staff holds infant at breast  LATCH Score: 5  Interventions Interventions: Breast feeding basics reviewed;Assisted with latch;Skin to skin;Breast massage;Hand express;Breast compression;Adjust position;Support pillows;Position options  Lactation Tools Discussed/Used WIC Program: No   Consult Status Consult Status: Follow-up Date: 10/21/19 Follow-up type: In-patient    Dyann Kief 10/21/2019, 10:34 AM

## 2019-10-21 NOTE — H&P (Signed)
Obstetric Preoperative History and Physical  Veronica Hurst is a 29 y.o. G1P0000 with IUP at [redacted]w[redacted]d presenting for presenting for scheduled primary cesarean section due to fetal malpresentation (breech). Declined external cephalic version.  No acute concerns.   Prenatal Course Source of Care: [facility] with onset of care at 8 weeks Pregnancy complications or risks: Patient Active Problem List   Diagnosis Date Noted  . Fetal malpresentation 10/21/2019  . Labor and delivery, indication for care 10/17/2019  . Obesity (BMI 35.0-39.9 without comorbidity) 04/20/2019  . Encounter for supervision of normal first pregnancy in second trimester 04/20/2019  . Tachycardia 12/21/2015  . Migraine without aura and without status migrainosus, not intractable 07/10/2015  . Attention deficit hyperactivity disorder (ADHD), inattentive type, moderate 03/06/2015  . Anxiety disorder 03/06/2015  . Hyperlipidemia LDL goal <100 03/06/2015  . Herpes zona 11/07/2013  . Acne inversa 10/11/2012   She plans to breastfeed She desires undecided method for postpartum contraception.   Prenatal labs and studies: ABO, Rh: --/--/O POS Performed at [facility], [address] [place], [place] [address]  [phone]) Antibody: NEG [id number]) Rubella: 4.64 (07/30 1426) RPR: Non Reactive (12/16 1012)  HBsAg: Negative (07/30 1426)  HIV: Non Reactive (07/30 1426)  GBS:--/Negative (02/12 1630) 1 hr Glucola: 105 (normal)   Genetic screening normal Anatomy US normal   Past Medical History:  Diagnosis Date  . Allergy   . Anxiety   . Hyperlipidemia LDL goal <100 03/06/2015  . Obesity (BMI 30-39.9)   . Tachycardia 12/21/2015    Past Surgical History:  Procedure Laterality Date  . INCISION AND DRAINAGE ABSCESS N/A    abdomen  . TONSILLECTOMY      OB History  Gravida Para Term Preterm AB Living  1 0 0 0 0 0  SAB TAB Ectopic Multiple Live Births  0 0 0 0 0    # Outcome Date GA Lbr  Len/2nd Weight Sex Delivery Anes PTL Lv  1 Current             Social History   Socioeconomic History  . Marital status: Single    Spouse name: Not on file  . Number of children: 0  . Years of education: Not on file  . Highest education level: Not on file  Occupational History  . Not on file  Tobacco Use  . Smoking status: Never Smoker  . Smokeless tobacco: Never Used  Substance and Sexual Activity  . Alcohol use: Not Currently    Alcohol/week: 0.0 standard drinks    Comment: occassional  . Drug use: No  . Sexual activity: Not Currently    Partners: Male    Birth control/protection: Other-see comments    Comment: Undecided  Other Topics Concern  . Not on file  Social History Narrative  . Not on file   Social Determinants of Health   Financial Resource Strain:   . Difficulty of Paying Living Expenses: Not on file  Food Insecurity:   . Worried About Programme researcher, broadcasting/film/video in the Last Year: Not on file  . Ran Out of Food in the Last Year: Not on file  Transportation Needs:   . Lack of Transportation (Medical): Not on file  . Lack of Transportation (Non-Medical): Not on file  Physical Activity:   . Days of Exercise per Week: Not on file  . Minutes of Exercise per Session: Not on file  Stress:   . Feeling of Stress : Not on file  Social Connections:   . Frequency of Communication with Friends and Family: Not on file  . Frequency of Social Gatherings with Friends and Family: Not on file  . Attends Religious Services: Not on file  . Active Member of Clubs or Organizations: Not on file  . Attends Archivist Meetings: Not on file  . Marital Status: Not on file    Family History  Problem Relation Age of Onset  . Hyperlipidemia Mother   . Hypertension Father   . Heart disease Father        on dad's side  . Thyroid disease Father   . Cancer Maternal Grandfather   . Thyroid disease Sister   . Breast cancer Maternal Aunt   . Thyroid disease Maternal Aunt      Medications Prior to Admission  Medication Sig Dispense Refill Last Dose  . Prenat w/o A Vit-FeFum-FePo-FA (CONCEPT OB) 130-92.4-1 MG CAPS Take 130 mg by mouth daily. (Patient taking differently: Take 1 capsule by mouth at bedtime. ) 30 capsule 11 10/20/2019 at Unknown time  . Ascorbic Acid (HALLS DEFENSE VITAMIN C DROPS MT) Use as directed 2 tablets in the mouth or throat daily.     . Cholecalciferol (VITAMIN D3) 50 MCG (2000 UT) TABS Take 2,000 Units by mouth daily.     . clotrimazole (LOTRIMIN) 1 % cream Apply 1 application topically 2 (two) times daily as needed (skin rash.).     Marland Kitchen diphenhydramine-acetaminophen (TYLENOL PM) 25-500 MG TABS tablet Take 2 tablets by mouth at bedtime as needed (sleep.).        No Known Allergies  Review of Systems: Negative except for what is mentioned in HPI.  Physical Exam: BP 117/79   Pulse (!) 101   Temp 97.6 F (36.4 C) (Oral)   Resp 18   Ht 5\' 2"  (1.575 m)   Wt 111.6 kg   LMP 01/19/2019 (Exact Date)   BMI 44.99 kg/m  FHR by Doppler: 140 bpm GENERAL: Well-developed, well-nourished female in no acute distress.  LUNGS: Clear to auscultation bilaterally.  HEART: Regular rate and rhythm. ABDOMEN: Soft, nontender, nondistended, gravid, PELVIC: Deferred EXTREMITIES: Nontender, no edema, 2+ distal pulses.    Pertinent Labs/Studies:   Results for orders placed or performed during the hospital encounter of 10/21/19 (from the past 72 hour(s))  ABO/Rh     Status: None   Collection Time: 10/21/19  6:05 AM  Result Value Ref Range   ABO/RH(D)      O POS Performed at [facility], [address] [place], [place] [address]    Bedside ultrasound performed today: Confirms complete frank breech presentation. Fetal head at maternal right.   Assessment and Plan :Veronica Hurst is a 29 y.o. G1P0000 at [redacted]w[redacted]d being admitted  for scheduled cesarean section delivery for breech C-section. The patient is understanding of the planned procedure and  is aware of and accepting of all surgical risks, including but not limited to: bleeding which may require transfusion or reoperation; infection which may require antibiotics; injury to bowel, bladder, ureters or other surrounding organs which may require repair; injury to the fetus; need for additional procedures including hysterectomy in the event of life-threatening complications; placental abnormalities wth subsequent pregnancies; incisional problems; blood clot disorders which may require blood thinners;, and other postoperative/anesthesia complications. The patient is in agreement with the proposed plan, and gives informed written consent for the procedure. All questions have been answered.    Rubie Maid, MD [facility]

## 2019-10-21 NOTE — Anesthesia Procedure Notes (Signed)
Spinal  Patient location during procedure: OR Start time: 10/21/2019 7:50 AM End time: 10/21/2019 7:55 AM Staffing Performed: resident/CRNA  Anesthesiologist: Yves Dill, MD Resident/CRNA: Rosanne Gutting, CRNA Preanesthetic Checklist Completed: patient identified, IV checked, site marked, risks and benefits discussed, surgical consent, monitors and equipment checked, pre-op evaluation and timeout performed Spinal Block Patient position: sitting Prep: DuraPrep Patient monitoring: heart rate, cardiac monitor, continuous pulse ox and blood pressure Approach: midline Location: L3-4 Injection technique: single-shot Needle Needle type: Sprotte  Needle gauge: 24 G Needle length: 9 cm Assessment Sensory level: T4

## 2019-10-21 NOTE — Transfer of Care (Signed)
Immediate Anesthesia Transfer of Care Note  Patient: Veronica Hurst  Procedure(s) Performed: CESAREAN SECTION (N/A )  Patient Location: Mother/Baby  Anesthesia Type:Spinal  Level of Consciousness: awake, alert  and oriented  Airway & Oxygen Therapy: Patient Spontanous Breathing  Post-op Assessment: Report given to RN and Post -op Vital signs reviewed and stable  Post vital signs: Reviewed and stable  Last Vitals:  Vitals Value Taken Time  BP 115/61 10/21/19 0918  Temp 36.6 C 10/21/19 0918  Pulse    Resp 18 10/21/19 0918  SpO2 100 % 10/21/19 0918    Last Pain:  Vitals:   10/21/19 0918  TempSrc: Oral  PainSc: 0-No pain         Complications: No apparent anesthesia complications

## 2019-10-21 NOTE — Anesthesia Preprocedure Evaluation (Signed)
Anesthesia Evaluation  Patient identified by MRN, date of birth, ID band Patient awake    Reviewed: Allergy & Precautions, NPO status , Patient's Chart, lab work & pertinent test results  Airway Mallampati: II  TM Distance: >3 FB     Dental  (+) Teeth Intact   Pulmonary neg pulmonary ROS,    Pulmonary exam normal        Cardiovascular negative cardio ROS Normal cardiovascular exam     Neuro/Psych  Headaches, PSYCHIATRIC DISORDERS Anxiety    GI/Hepatic negative GI ROS, Neg liver ROS,   Endo/Other  Morbid obesity  Renal/GU negative Renal ROS  negative genitourinary   Musculoskeletal   Abdominal Normal abdominal exam  (+)   Peds negative pediatric ROS (+)  Hematology negative hematology ROS (+)   Anesthesia Other Findings Past Medical History: No date: Allergy No date: Anxiety 03/06/2015: Hyperlipidemia LDL goal <100 No date: Obesity (BMI 30-39.9) 12/21/2015: Tachycardia  Reproductive/Obstetrics (+) Pregnancy                             Anesthesia Physical Anesthesia Plan  ASA: II  Anesthesia Plan: Spinal   Post-op Pain Management:    Induction: Intravenous  PONV Risk Score and Plan:   Airway Management Planned: Nasal Cannula  Additional Equipment:   Intra-op Plan:   Post-operative Plan:   Informed Consent: I have reviewed the patients History and Physical, chart, labs and discussed the procedure including the risks, benefits and alternatives for the proposed anesthesia with the patient or authorized representative who has indicated his/her understanding and acceptance.     Dental advisory given  Plan Discussed with: CRNA and Surgeon  Anesthesia Plan Comments:         Anesthesia Quick Evaluation

## 2019-10-21 NOTE — Op Note (Signed)
Cesarean Section Procedure Note  Indications: malpresentation: breech  Pre-operative Diagnosis: 39 week 2 day pregnancy, fetal malpresentation (breech), obesity of pregnancy.  Post-operative Diagnosis: same  Surgeon: Hildred Laser, MD  Assistants: Doreene Burke, CNM. No other capable assistant available in surgery requiring high level assistant.  Procedure: Primary Cesarean Section  Anesthesia: Spinal anesthesia  Findings: Female infant, frank breech presentation, 3060 grams, with Apgar scores of 9 at one minute and 9 at five minutes. Clear amniotic fluid at rupture.  Intact placenta with 3 vessel cord.  The uterine outline, tubes and ovaries appeared normal.   Procedure Details: The patient was seen in the Holding Room. The risks, benefits, complications, treatment options, and expected outcomes were discussed with the patient.  The patient concurred with the proposed plan, giving informed consent.  The site of surgery properly noted/marked. The patient was taken to the Operating Room, identified as Veronica Hurst and the procedure verified as C-Section Delivery. A Time Out was held and the above information confirmed.  After induction of anesthesia, the patient was draped and prepped in the usual sterile manner. Anesthesia was tested and noted to be adequate. A Pfannenstiel incision was made and carried down through the subcutaneous tissue to the fascia. Fascial incision was made and extended transversely. The fascia was separated from the underlying rectus tissue superiorly and inferiorly. The peritoneum was identified and entered. Peritoneal incision was extended longitudinally. The surgical assist was able to provide retraction to allow for clear visualization of surgical site. The utero-vesical peritoneal reflection was incised transversely and the bladder flap was bluntly freed from the lower uterine segment. A low transverse uterine incision was made. Delivered from frank breech  presentation was a 3060 gram Female with Apgar scores of 9 at one minute and 9 at five minutes.  The assistant was able to apply adequate fundal pressure to allow for successful delivery of the fetus. After the umbilical cord was clamped and cut cord blood was obtained for evaluation. The placenta was removed intact and appeared normal. The uterus was exteriorized and cleared of all clots and debris. The uterine outline, tubes and ovaries appeared normal.  The uterine incision was closed with running locked sutures of 0-Vicryl.  A second suture of 0-Vicryl was used in an imbricating layer.  Hemostasis was observed. Lavage was carried out until clear. The muscle layer was reapproximated with a figure-of-eight suture of 2-0 Vicryl. The fascia was then reapproximated with a running suture of 1-0 Vicryl.  The fascia was injected with approximately 30 ml of Exparel solution.  The subcutaneous fat layer was reapproximated with 3-0 Vicryl. The skin was reapproximated with 4-0 Monocryl. The skin was then injected with an additional 30 ml of Exparel solution. Steri-strips were applied to the incision site.   Instrument, sponge, and needle counts were correct prior the abdominal closure and at the conclusion of the case.    Estimated Blood Loss:  723 ml      Drains: foley catheter to gravity drainage, 150 ml of clear urine at end of the procedure         Total IV Fluids:  650 ml  Specimens: None         Implants: None         Complications:  None; patient tolerated the procedure well.         Disposition: PACU - hemodynamically stable.         Condition: stable    Hildred Laser, MD [facility]

## 2019-10-22 DIAGNOSIS — O321XX0 Maternal care for breech presentation, not applicable or unspecified: Secondary | ICD-10-CM

## 2019-10-22 DIAGNOSIS — O99214 Obesity complicating childbirth: Secondary | ICD-10-CM

## 2019-10-22 DIAGNOSIS — Z3A39 39 weeks gestation of pregnancy: Secondary | ICD-10-CM

## 2019-10-22 LAB — CBC
HCT: 29.5 % — ABNORMAL LOW (ref 36.0–46.0)
Hemoglobin: 9.5 g/dL — ABNORMAL LOW (ref 12.0–15.0)
MCH: 29.5 pg (ref 26.0–34.0)
MCHC: 32.2 g/dL (ref 30.0–36.0)
MCV: 91.6 fL (ref 80.0–100.0)
Platelets: 162 10*3/uL (ref 150–400)
RBC: 3.22 MIL/uL — ABNORMAL LOW (ref 3.87–5.11)
RDW: 13.8 % (ref 11.5–15.5)
WBC: 9.5 10*3/uL (ref 4.0–10.5)
nRBC: 0 % (ref 0.0–0.2)

## 2019-10-22 NOTE — Lactation Note (Signed)
This note was copied from a baby's chart. Lactation Consultation Note  Patient Name: Veronica Hurst Date: 10/22/2019 Reason for consult: Follow-up assessment;Mother's request;Difficult latch;Primapara;Term;Other (Comment)(Sleepy after circumcision)  Veronica Hurst was circumcised this am.  Since then he has not wanted to wake to latch.  When went in room he was starting  to stir.  Pointed out feeding cues and encouraged to put him to the breast whenever he demonstrated hunger cues.  Mom wanting to sit on side of bed to breast feed.  Discussed not breast feeding in this position every time because it could put strain on her back with no back support.  Hand expressed colostrum easily to entice Veronica Hurst to latch.  Placed Veronica Hurst in football hold with plenty of pillow support putting him up to the level of the breast so mom would not be leaning over.  He opened his mouth wide with flanged lips to latch deeply.  After several sucks, he starts sucking in lower lip causing him to slip to tip of nipple with shallow latch or coming off the breast.  Explained importance of deep latch.  Demonstrated how to massage breast and gently touch chin to keep him actively sucking at the breast for 10 minutes before falling asleep and coming off breast.  Whenever we would hand express a little colostrum in his mouth, he would lick it off, but not continue to suck.  Easily hand expressed a teaspoon which was spoon fed.  He was no longer licking out his tongue, not interested in sucking and fell asleep appearing satiated.  Reviewed normal stomach size, supply and demand, normal course of lactation and routine newborn feeding pattern.  Lactation resource hand out with contact numbers given and reviewed encouraging mom to call with any questions, concerns or assistance.  Maternal Data Formula Feeding for Exclusion: No Has patient been taught Hand Expression?: Yes  Feeding Feeding Type: Breast Fed  LATCH Score Latch: Repeated  attempts needed to sustain latch, nipple held in mouth throughout feeding, stimulation needed to elicit sucking reflex.  Audible Swallowing: A few with stimulation  Type of Nipple: Everted at rest and after stimulation  Comfort (Breast/Nipple): Soft / non-tender  Hold (Positioning): Assistance needed to correctly position infant at breast and maintain latch.  LATCH Score: 7  Interventions Interventions: Breast feeding basics reviewed;Assisted with latch;Skin to skin;Breast massage;Hand express;Pre-pump if needed;Reverse pressure;Breast compression;Adjust position;Support pillows;Position options;Coconut oil  Lactation Tools Discussed/Used WIC Program: No([employer], [employer] & [employer]) Pump Review: Setup, frequency, and cleaning;Milk Storage;Other (comment) Initiated by:: M.Barbee,RNC, BSN, ICBLC Date initiated:: 10/21/19   Consult Status Consult Status: PRN Follow-up type: Call as needed    Louis Meckel 10/22/2019, 4:27 PM

## 2019-10-22 NOTE — Progress Notes (Signed)
Parents watched Period of Purple Crying and v/o.

## 2019-10-22 NOTE — Progress Notes (Signed)
Postpartum Day # 1: Cesarean Delivery (primary) for fetal malpresentation at term  Subjective: Patient reports tolerating PO, + flatus and no problems voiding.  Tolerating PO. Ambulating without difficulty.   Objective: Vital signs in last 24 hours: Temp:  [97.7 F (36.5 C)-98.9 F (37.2 C)] 98.3 F (36.8 C) (03/06 0719) Pulse Rate:  [89-114] 103 (03/06 0719) Resp:  [9-18] 18 (03/06 0719) BP: (97-113)/(50-72) 109/52 (03/06 0719) SpO2:  [95 %-100 %] 98 % (03/06 0719)  Physical Exam:  General: alert and no distress Lungs: clear to auscultation bilaterally Breasts: normal appearance, no masses or tenderness Heart: regular rate and rhythm, S1, S2 normal, no murmur, click, rub or gallop Abdomen: soft, non-tender; bowel sounds normal; no masses,  no organomegaly Pelvis: Lochia appropriate, Uterine Fundus firm, Incision: healing well, no dehiscence, no significant erythema. Dressing in place.  Extremities: DVT Evaluation: No evidence of DVT seen on physical exam. egative Homan's sign. No cords or calf tenderness. No significant calf/ankle edema.  Recent Labs    10/20/19 0833 10/22/19 0744  HGB 12.4 9.5*  HCT 38.0 29.5*    Assessment/Plan: Status post Cesarean section. Doing well postoperatively.  Continue routine postpartum/post-op care. Encourage ambulation  Breastfeeding, s/p Lactation consult Circumcision prior to discharge  Contraception undecided.  Discussed options, patient notes she may consider the patch. Mild anemia postpartum s/p surgical blood loss. Asymptomatic. Can treat with PO iron supplementation. Plan for discharge tomorrow   Hildred Laser, MD [facility]

## 2019-10-23 ENCOUNTER — Ambulatory Visit: Payer: Self-pay

## 2019-10-23 MED ORDER — OXYCODONE-ACETAMINOPHEN 5-325 MG PO TABS: 1.0000 | ORAL_TABLET | Freq: Four times a day (QID) | ORAL | 0 refills | Status: DC | PRN

## 2019-10-23 MED ORDER — FERROUS SULFATE 325 (65 FE) MG PO TABS: 325.0000 mg | ORAL_TABLET | Freq: Two times a day (BID) | ORAL | 3 refills | Status: DC

## 2019-10-23 MED ORDER — IBUPROFEN 800 MG PO TABS: 800.0000 mg | ORAL_TABLET | Freq: Four times a day (QID) | ORAL | 0 refills | Status: DC

## 2019-10-23 NOTE — Discharge Summary (Signed)
Discharge Summary  Date of Admission: 10/21/2019  Date of Discharge: 10/23/2019  Admitting Diagnosis: Scheduled cesarean section at [redacted]w[redacted]d for breech presentation  Mode of Delivery: primary cesarean section , low uterine, transverse     Discharge Diagnosis: No other diagnosis   Intrapartum Procedures: none   Post partum procedures: none  Complications: none                      Discharge Day SOAP Note:  Progress Note - Vaginal Delivery  Veronica Hurst is a 29 y.o. G1P1001 now PP day 2 s/p C-Section, Low Transverse . Delivery was uncomplicated  Subjective  The patient has the following complaints: has no unusual complaints  Pain is controlled with current medications.   Patient is urinating without difficulty.  She is ambulating well.     Objective  Vital signs: BP (!) 109/54 (BP Location: Right Arm)   Pulse 89   Temp 97.7 F (36.5 C) (Oral)   Resp 18   Ht 5\' 2"  (1.575 m)   Wt 111.6 kg   LMP 01/19/2019 (Exact Date)   SpO2 99% Comment: RA  Breastfeeding Unknown   BMI 44.99 kg/m   Physical Exam: Gen: NAD Fundus Fundal Tone: Firm  Lochia Amount: Small  Perineum Appearance: Intact     Data Review Labs: CBC Latest Ref Rng & Units 10/22/2019 10/20/2019 08/03/2019  WBC 4.0 - 10.5 K/uL 9.5 9.6 10.5  Hemoglobin 12.0 - 15.0 g/dL 08/05/2019) 6.9(C 78.9  Hematocrit 36.0 - 46.0 % 29.5(L) 38.0 36.5  Platelets 150 - 400 K/uL 162 169 198   O POS Performed at [facility], [address] [place], [place]   Assessment/Plan  Active Problems:   Obesity (BMI 35.0-39.9 without comorbidity)   Fetal malpresentation   S/P cesarean section    Plan for discharge today.   Discharge Instructions: Per After Visit Summary. Activity: Advance as tolerated. Pelvic rest for 6 weeks.  Also refer to After Visit Summary Diet: Regular Medications: Allergies as of 10/23/2019   No Known Allergies     Medication List    TAKE these  medications   clotrimazole 1 % cream Commonly known as: LOTRIMIN Apply 1 application topically 2 (two) times daily as needed (skin rash.).   Concept OB 130-92.4-1 MG Caps Take 130 mg by mouth daily. What changed:   how much to take  when to take this   diphenhydramine-acetaminophen 25-500 MG Tabs tablet Commonly known as: TYLENOL PM Take 2 tablets by mouth at bedtime as needed (sleep.).   ferrous sulfate 325 (65 FE) MG tablet Take 1 tablet (325 mg total) by mouth 2 (two) times daily with a meal.   HALLS DEFENSE VITAMIN C DROPS MT Use as directed 2 tablets in the mouth or throat daily.   ibuprofen 800 MG tablet Commonly known as: ADVIL Take 1 tablet (800 mg total) by mouth every 6 (six) hours.   oxyCODONE-acetaminophen 5-325 MG tablet Commonly known as: Percocet Take 1-2 tablets by mouth every 6 (six) hours as needed for severe pain.   Vitamin D3 50 MCG (2000 UT) Tabs Take 2,000 Units by mouth daily.      Outpatient follow up:  1 week with Dr. 12/23/2019 for incision check  Postpartum contraception: Plans patch with start 6 wks pp.   Discharged  Condition: good  Discharged to: home  Newborn Data: Disposition:home with mother  Apgars: APGAR (1 MIN): 9   APGAR (5 MINS): 9   APGAR (10 MINS):    Baby Feeding: Breast  Philip Aspen, CNM  10/23/2019 10:19 AM

## 2019-10-23 NOTE — Final Progress Note (Signed)
Discharge Day SOAP Note:  Progress Note - Vaginal Delivery  Veronica Hurst is a 29 y.o. G1P1001 now PP day 2 s/p C-Section, Low Transverse . Delivery was uncomplicated  Subjective  The patient has the following complaints: has no unusual complaints  Pain is controlled with current medications.   Patient is urinating without difficulty.  She is ambulating well.     Objective  Vital signs: BP (!) 109/54 (BP Location: Right Arm)   Pulse 89   Temp 97.7 F (36.5 C) (Oral)   Resp 18   Ht 5\' 2"  (1.575 m)   Wt 111.6 kg   LMP 01/19/2019 (Exact Date)   SpO2 99% Comment: RA  Breastfeeding Unknown   BMI 44.99 kg/m   Physical Exam: Gen: NAD Fundus Fundal Tone: Firm  Lochia Amount: Small  Perineum Appearance: Intact     Data Review Labs: CBC Latest Ref Rng & Units 10/22/2019 10/20/2019 08/03/2019  WBC 4.0 - 10.5 K/uL 9.5 9.6 10.5  Hemoglobin 12.0 - 15.0 g/dL 08/05/2019) 9.3(X 90.2  Hematocrit 36.0 - 46.0 % 29.5(L) 38.0 36.5  Platelets 150 - 400 K/uL 162 169 198   O POS Performed at [facility], [address] [place], [place]   Assessment/Plan  Active Problems:   Obesity (BMI 35.0-39.9 without comorbidity)   Fetal malpresentation   S/P cesarean section    Plan for discharge today.   Discharge Instructions: Per After Visit Summary. Activity: Advance as tolerated. Pelvic rest for 6 weeks.  Also refer to After Visit Summary Diet: Regular Medications: Allergies as of 10/23/2019   No Known Allergies     Medication List    TAKE these medications   clotrimazole 1 % cream Commonly known as: LOTRIMIN Apply 1 application topically 2 (two) times daily as needed (skin rash.).   Concept OB 130-92.4-1 MG Caps Take 130 mg by mouth daily. What changed:   how much to take  when to take this   diphenhydramine-acetaminophen 25-500 MG Tabs tablet Commonly known as: TYLENOL PM Take 2 tablets by mouth at bedtime as needed (sleep.).    ferrous sulfate 325 (65 FE) MG tablet Take 1 tablet (325 mg total) by mouth 2 (two) times daily with a meal.   HALLS DEFENSE VITAMIN C DROPS MT Use as directed 2 tablets in the mouth or throat daily.   ibuprofen 800 MG tablet Commonly known as: ADVIL Take 1 tablet (800 mg total) by mouth every 6 (six) hours.   oxyCODONE-acetaminophen 5-325 MG tablet Commonly known as: Percocet Take 1-2 tablets by mouth every 6 (six) hours as needed for severe pain.   Vitamin D3 50 MCG (2000 UT) Tabs Take 2,000 Units by mouth daily.      Outpatient follow up:  1 week with Dr. 12/23/2019 for incision check  Postpartum contraception: Plans patch with start 6 wks pp.   Discharged Condition: good  Discharged to: home  Newborn Data: Disposition:home with mother  Apgars: APGAR (1 MIN): 9   APGAR (5 MINS): 9   APGAR (10 MINS):    Baby Feeding: Breast  Chiquita Loth, CNM  10/23/2019 10:19 AM

## 2019-10-23 NOTE — Progress Notes (Signed)
DC instructions reviewed with pt and FOB.  Discussed concerns that would need provider notification.  Pt verb u/o.  Inst to call for incision check appointment tomorrow for 1 wk with [facility] or Dr. Valentino Saxon.  Edinburgh score is 8.  Encouraged to talk with provider about this also at the 1 wk appointment.

## 2019-10-23 NOTE — Anesthesia Postprocedure Evaluation (Signed)
Anesthesia Post Note  Patient: SAMAH LAPIANA  Procedure(s) Performed: CESAREAN SECTION (N/A )  Patient location during evaluation: Mother Baby Anesthesia Type: Spinal Level of consciousness: oriented and awake and alert Pain management: pain level controlled Vital Signs Assessment: post-procedure vital signs reviewed and stable Respiratory status: spontaneous breathing and respiratory function stable Cardiovascular status: blood pressure returned to baseline and stable Postop Assessment: no headache, no backache, no apparent nausea or vomiting and able to ambulate Anesthetic complications: no     Last Vitals:  Vitals:   10/23/19 0259 10/23/19 0900  BP: 109/61 (!) 109/54  Pulse: 90 89  Resp: 20 18  Temp: 36.6 C 36.5 C  SpO2: 98% 99%    Last Pain:  Vitals:   10/23/19 0900  TempSrc: Oral  PainSc:                  Christia Reading

## 2019-10-23 NOTE — Progress Notes (Signed)
DC to home.  To car via wc with Auxillary.

## 2019-10-23 NOTE — Lactation Note (Signed)
This note was copied from a baby's chart. Lactation Consultation Note  Patient Name: Veronica Hurst [id number] Date: 10/23/2019   When went in to visit mom this am, Veronica Hurst was rooting and licking out tongue.  Pointed out subtle feeding cues.  Mom willing to put him back to the breast even though he had just fed an hour before.  Veronica Hurst latched with minimal assistance and began strong rhythmic sucking with swallows.  He sustained the latch with vigorous sucking for 10 minutes before coming on and off the breast and then falling asleep appearing satiated.  Praised mom for her commitment to put Veronica Hurst to the breast whenever he demonstrated feeding cues.  Mom reports slight nipple tenderness.  No trauma noted to nipples.  Demonstrated how to hand express some colostrum after breast feed to rub on nipples to prevent bacteria,  lubrication and for healing and discomfort.  Mom reports having nipple cream at home if needed.  Parents had lots of first time parent questions like what foods she should avoid while breast feeding because he seemed to be a little gassy.  Discussed moderation of gassy foods, caffeine consumption, foods that can decrease milk supply or increase milk supply, etc and what to look for if there was an allergy to something she had eaten.  Explained differences and prevention of full breast, engorgement and mastitis.  Discussed when and how to introduce pumping, bottles and pacifiers when and if she was ready.  How to know Veronica Hurst was getting enough breast milk when mature milk transitions in.  Reviewed hand out on community resources, contact numbers and out patient consults if needed.       Maternal Data    Feeding    LATCH Score                   Interventions    Lactation Tools Discussed/Used     Consult Status      Louis Meckel 10/23/2019, 3:47 PM

## 2019-10-25 ENCOUNTER — Ambulatory Visit: Payer: Managed Care, Other (non HMO) | Admitting: Obstetrics and Gynecology

## 2019-10-28 ENCOUNTER — Encounter: Payer: Self-pay | Admitting: Obstetrics and Gynecology

## 2019-10-28 ENCOUNTER — Ambulatory Visit (INDEPENDENT_AMBULATORY_CARE_PROVIDER_SITE_OTHER): Payer: Managed Care, Other (non HMO) | Admitting: Obstetrics and Gynecology

## 2019-10-28 ENCOUNTER — Other Ambulatory Visit: Payer: Self-pay

## 2019-10-28 VITALS — BP 111/76 | HR 103 | Ht 62.0 in | Wt 229.4 lb

## 2019-10-28 DIAGNOSIS — Z98891 History of uterine scar from previous surgery: Secondary | ICD-10-CM

## 2019-10-28 DIAGNOSIS — R600 Localized edema: Secondary | ICD-10-CM

## 2019-10-28 DIAGNOSIS — Z4889 Encounter for other specified surgical aftercare: Secondary | ICD-10-CM

## 2019-10-28 NOTE — Progress Notes (Signed)
    OBSTETRICS/GYNECOLOGY POST-OPERATIVE CLINIC VISIT  Subjective:     Veronica Hurst is a 29 y.o. female who presents to the clinic 1 weeks status post primary low-transverse C-section for fetal malpresentation breech) at term. Eating a regular diet without difficulty. Bowel movements are normal. The patient is not having any pain.  The following portions of the patient's history were reviewed and updated as appropriate: allergies, current medications, past family history, past medical history, past social history, past surgical history and problem list.  Review of Systems A comprehensive review of systems was negative except for: Cardiovascular: positive for lower extremity edema    Objective:    BP 111/76   Pulse (!) 103   Ht 5\' 2"  (1.575 m)   Wt 229 lb 6.4 oz (104.1 kg)   LMP 01/19/2019 (Exact Date)   BMI 41.96 kg/m  General:  alert and no distress  Abdomen: soft, bowel sounds active, non-tender  Incision:   healing well, no drainage, no erythema, no hernia, no seroma, no swelling, no dehiscence, incision overall well approximated with 1 cm on each lateral edge with superficial wound separation  Extremities: Non-tender    Pathology:  None  Assessment:   1. Encounter for postoperative wound check   2. S/P cesarean section   3. Bilateral lower extremity edema    Plan:   1. Continue any current medications as needed. 2. Wound care discussed.  Steri-strips placed on edges of superficial separation.  3.Activity restrictions: no excessive bending, stooping, or squatting and no lifting more than 10-15 pounds 4. Disucssed use of compression stockings for lower extremity edema postpartum.  Will likely resolve within the next 2-3 weeks.  5. Anticipated return to work: 6 weeks. 6. Follow up: 5 weeks for postpartum visit with midwives 03/21/2019, CNM).     Doreene Burke, MD [facility]

## 2019-10-28 NOTE — Progress Notes (Signed)
Pt present for post op visit after c-section. Pt stated that she was doing well no problems.

## 2019-12-05 ENCOUNTER — Other Ambulatory Visit: Payer: Self-pay

## 2019-12-05 ENCOUNTER — Encounter: Payer: Self-pay | Admitting: Certified Nurse Midwife

## 2019-12-05 ENCOUNTER — Ambulatory Visit (INDEPENDENT_AMBULATORY_CARE_PROVIDER_SITE_OTHER): Payer: Managed Care, Other (non HMO) | Admitting: Certified Nurse Midwife

## 2019-12-05 NOTE — Progress Notes (Signed)
Subjective:    Veronica Hurst is a 29 y.o. G38P1001 Caucasian female who presents for a postpartum visit. She is 6 weeks postpartum following a primary cesarean section, low transverse incision at 39.2 gestational weeks. Anesthesia: spinal. I have fully reviewed the prenatal and intrapartum course. Postpartum course has been WNL. Baby's course has been WNL. Baby is feeding by breast. Bleeding no bleeding. Bowel function is normal. Bladder function is normal. Patient is not sexually active.. Contraception method is oral progesterone-only contraceptive ( samples given). Postpartum depression screening: negative(mild). Score 4.  Last pap 04/21/2017 and was normal.  The following portions of the patient's history were reviewed and updated as appropriate: allergies, current medications, past medical history, past surgical history and problem list.  Review of Systems Pertinent items are noted in HPI.   Vitals:   12/05/19 1036  BP: 110/63  Pulse: 93  Weight: 223 lb 9 oz (101.4 kg)  Height: 5\' 3"  (1.6 m)   Patient's last menstrual period was 01/19/2019 (exact date).  Objective:   General:  alert, cooperative and no distress   Breasts:  deferred, no complaints  Lungs: clear to auscultation bilaterally  Heart:  regular rate and rhythm  Abdomen: soft, nontender   Vulva: normal  Vagina: normal vagina  Cervix:  closed  Corpus: Well-involuted  Adnexa:  Non-palpable  Rectal Exam: no hemorrhoids        Assessment:   Postpartum exam 6 wks s/p primary cesarean section Breastfeeding Depression screening Contraception counseling   Plan:  : oral progesterone-only contraceptive Follow up in: 6 months for annual exam or earlier if needed  03/21/2019 CNM

## 2019-12-05 NOTE — Patient Instructions (Signed)

## 2020-03-13 ENCOUNTER — Ambulatory Visit: Payer: Managed Care, Other (non HMO) | Admitting: Internal Medicine

## 2020-03-30 ENCOUNTER — Ambulatory Visit: Payer: Managed Care, Other (non HMO) | Admitting: Internal Medicine

## 2020-03-30 ENCOUNTER — Encounter: Payer: Self-pay | Admitting: Internal Medicine

## 2020-03-30 ENCOUNTER — Other Ambulatory Visit: Payer: Self-pay

## 2020-03-30 VITALS — BP 104/66 | HR 82 | Temp 98.0°F | Resp 15 | Ht 63.0 in | Wt 236.2 lb

## 2020-03-30 DIAGNOSIS — R000 Tachycardia, unspecified: Secondary | ICD-10-CM | POA: Diagnosis not present

## 2020-03-30 DIAGNOSIS — F53 Postpartum depression: Secondary | ICD-10-CM

## 2020-03-30 DIAGNOSIS — E785 Hyperlipidemia, unspecified: Secondary | ICD-10-CM

## 2020-03-30 DIAGNOSIS — O99345 Other mental disorders complicating the puerperium: Secondary | ICD-10-CM

## 2020-03-30 DIAGNOSIS — R5383 Other fatigue: Secondary | ICD-10-CM

## 2020-03-30 DIAGNOSIS — N911 Secondary amenorrhea: Secondary | ICD-10-CM

## 2020-03-30 DIAGNOSIS — F411 Generalized anxiety disorder: Secondary | ICD-10-CM

## 2020-03-30 HISTORY — DX: Postpartum depression: F53.0

## 2020-03-30 MED ORDER — SERTRALINE HCL 50 MG PO TABS: 50.0000 mg | ORAL_TABLET | Freq: Every day | ORAL | 0 refills | Status: DC

## 2020-03-30 NOTE — Assessment & Plan Note (Signed)
Etiology unclear.  Will screen for thyroid, anemia,  hepatic and renal insufficiency, encourage regular exercise 5 days /week,  consider sleep study and cardiology evaluation if  Snoring noted or exertional dyspnea reported 

## 2020-03-30 NOTE — Assessment & Plan Note (Signed)
Sertraline chosen to manage both depressive and anxiety symptom s.  Return one month

## 2020-03-30 NOTE — Progress Notes (Signed)
Subjective:  Patient ID: Veronica Hurst, female    DOB: 28-Jun-1991  Age: 29 y.o. MRN: [id number]  CC: The primary encounter diagnosis was Fatigue, unspecified type. Diagnoses of Tachycardia, Hyperlipidemia LDL goal <100, Morbid obesity (HCC), Post partum depression, Generalized anxiety disorder, and Amenorrhea, secondary were also pertinent to this visit.  HPI Veronica Hurst presents for establishment of care  This visit occurred during the SARS-CoV-2 public health emergency.  Safety protocols were in place, including screening questions prior to the visit, additional usage of staff PPE, and extensive cleaning of exam room while observing appropriate contact time as indicated for disinfecting solutions.   VAGINAL DELIVERY VIA C SECT MARCH 5.  Baby not sleeping well for the last 3 months.  Positive depression aggravated by sleep deprivation.  Nursing .  Husband is a IT sales professional.  Son Arita Miss will not sleep until he breast feeds  And only drinks small amounts  So she has not had more than 3-4 hours per night works full time as a Associate Professor.   Feels conflicted by wanting to switch to formula because her sister is making her feel guilty bu mother says do it.   Obesity:  Lost 60 lbs a few years ago,  Using the KETO diet.  Then gained it back since being pregnant.  Was 236 and got down to 160 lbs at cnception in May 2020. Still gaining weight.  Hungry constantly.  Pumping 6 times daily .  No period since  Last  May .   REmote history of GAD post graduation from HS ,  Took lexapro but stopped due to weight gain.   History Veronica Hurst has a past medical history of Allergy, Anxiety, Hyperlipidemia LDL goal <100 (03/06/2015), Obesity (BMI 30-39.9), Post partum depression (03/30/2020), and Tachycardia (12/21/2015).   She has a past surgical history that includes Tonsillectomy; Incision and drainage abscess (N/A); and Cesarean section (N/A, 10/21/2019).   Her family history includes Breast cancer in her maternal  aunt; Cancer in her maternal grandfather; Heart disease in her father; Hyperlipidemia in her mother; Hypertension in her father; Thyroid disease in her father, maternal aunt, and sister.She reports that she has never smoked. She has never used smokeless tobacco. She reports previous alcohol use. She reports that she does not use drugs.  Outpatient Medications Prior to Visit  Medication Sig Dispense Refill  . Ascorbic Acid (HALLS DEFENSE VITAMIN C DROPS MT) Use as directed 2 tablets in the mouth or throat daily.    . Cholecalciferol (VITAMIN D3) 50 MCG (2000 UT) TABS Take 2,000 Units by mouth daily.    Burnis Medin w/o A Vit-FeFum-FePo-FA (CONCEPT OB) 130-92.4-1 MG CAPS Take 130 mg by mouth daily. (Patient taking differently: Take 1 capsule by mouth at bedtime. ) 30 capsule 11  . Zinc 50 MG TABS Take 1 tablet by mouth daily.    . clotrimazole (LOTRIMIN) 1 % cream Apply 1 application topically 2 (two) times daily as needed (skin rash.). (Patient not taking: Reported on 03/30/2020)    . diphenhydramine-acetaminophen (TYLENOL PM) 25-500 MG TABS tablet Take 2 tablets by mouth at bedtime as needed (sleep.).  (Patient not taking: Reported on 03/30/2020)    . ferrous sulfate 325 (65 FE) MG tablet Take 1 tablet (325 mg total) by mouth 2 (two) times daily with a meal. (Patient not taking: Reported on 12/05/2019) 30 tablet 3  . ibuprofen (ADVIL) 800 MG tablet Take 1 tablet (800 mg total) by mouth every 6 (six) hours. (Patient not taking:  Reported on 03/30/2020) 30 tablet 0  . oxyCODONE-acetaminophen (PERCOCET) 5-325 MG tablet Take 1-2 tablets by mouth every 6 (six) hours as needed for severe pain. (Patient not taking: Reported on 10/28/2019) 10 tablet 0   No facility-administered medications prior to visit.    Review of Systems:  Patient denies headache, fevers, malaise, unintentional weight loss, skin rash, eye pain, sinus congestion and sinus pain, sore throat, dysphagia,  hemoptysis , cough, dyspnea, wheezing,  chest pain, palpitations, orthopnea, edema, abdominal pain, nausea, melena, diarrhea, constipation, flank pain, dysuria, hematuria, urinary  Frequency, nocturia, numbness, tingling, seizures,  Focal weakness, Loss of consciousness,  Tremor, insomnia, depression, anxiety, and suicidal ideation.     Objective:  BP 104/66 (BP Location: Left Arm, Patient Position: Sitting, Cuff Size: Large)   Pulse 82   Temp 98 F (36.7 C) (Oral)   Resp 15   Ht 5\' 3"  (1.6 m)   Wt 236 lb 3.2 oz (107.1 kg)   SpO2 98%   BMI 41.84 kg/m   Physical Exam:   Assessment & Plan:   Problem List Items Addressed This Visit      Unprioritized   Hyperlipidemia LDL goal <100   Relevant Orders   Lipid panel   Tachycardia   Relevant Orders   Comprehensive metabolic panel   Thyroid Panel With TSH   Amenorrhea, secondary    Has not had a period since preconception (May 2020).  consider PCOS,  thryoid      Anxiety disorder    Sertraline chosen to manage both depressive and anxiety symptom s.  Return one month       Relevant Medications   sertraline (ZOLOFT) 50 MG tablet   Fatigue - Primary    Etiology unclear.  Will screen for thyroid, anemia,  hepatic and renal insufficiency, encourage regular exercise 5 days /week,  consider sleep study and cardiology evaluation if  Snoring noted or exertional dyspnea reported      Relevant Orders   CBC with Differential/Platelet   Morbid obesity (HCC)     I have addressed  BMI and recommended a low glycemic index diet regular participation in aerobic exercise with a goal of 30 minutes of aerobic exercise a minimum of 5 days per week. Screening for lipid disorders, thyroid and diabetes to be done today.      Relevant Orders   Hemoglobin A1c   Post partum depression    Sertraline starting at 25 mg daily  For week 1.  Then 50 mg daily thereafter.  encouraged to add Nonpharmacologic therapy including daily walks,  Increased utilization of assistance offered by mother,  especially on the days her husband has a 24 hour shift      Relevant Medications   sertraline (ZOLOFT) 50 MG tablet      I have discontinued Shamone E. Minasyan's diphenhydramine-acetaminophen, clotrimazole, ibuprofen, ferrous sulfate, and oxyCODONE-acetaminophen. I am also having her maintain her Concept OB, Ascorbic Acid (HALLS DEFENSE VITAMIN C DROPS MT), Vitamin D3, Zinc, and sertraline.  Meds ordered this encounter  Medications  . DISCONTD: sertraline (ZOLOFT) 50 MG tablet    Sig: Take 1 tablet (50 mg total) by mouth daily.    Dispense:  90 tablet    Refill:  0  . sertraline (ZOLOFT) 50 MG tablet    Sig: Take 1 tablet (50 mg total) by mouth daily.    Dispense:  90 tablet    Refill:  0  A total of 45 minutes of face to face time was spent  with patient more than half of which was spent in counselling and coordination of care   Medications Discontinued During This Encounter  Medication Reason  . clotrimazole (LOTRIMIN) 1 % cream   . diphenhydramine-acetaminophen (TYLENOL PM) 25-500 MG TABS tablet   . ferrous sulfate 325 (65 FE) MG tablet   . ibuprofen (ADVIL) 800 MG tablet   . oxyCODONE-acetaminophen (PERCOCET) 5-325 MG tablet   . sertraline (ZOLOFT) 50 MG tablet     Follow-up: Return in about 4 weeks (around 04/27/2020).   Sherlene Shams, MD

## 2020-03-30 NOTE — Assessment & Plan Note (Signed)
I have addressed  BMI and recommended a low glycemic index diet regular participation in aerobic exercise with a goal of 30 minutes of aerobic exercise a minimum of 5 days per week. Screening for lipid disorders, thyroid and diabetes to be done today.  

## 2020-03-30 NOTE — Patient Instructions (Addendum)
NICE TO MEET YOU!!    Walk for 30 minutes daily   I am prescribing sertraline  For the post partum mood issues:  start with 1/2 tablet  Daily with food (carbs)  For 1 week.  If tolerating without nausea increase to a full tablet  Thereafter.  Follow up one month   Talk to your  Moms about  Helping  Out during  the 24 hour periods    Think about the Optavia diet to help with the weight issue.  I'm seeing Very very good results based on at least 20 patients who have been using it

## 2020-03-30 NOTE — Assessment & Plan Note (Signed)
Has not had a period since preconception (May 2020).  consider PCOS,  thryoid

## 2020-03-30 NOTE — Assessment & Plan Note (Signed)
Sertraline starting at 25 mg daily  For week 1.  Then 50 mg daily thereafter.  encouraged to add Nonpharmacologic therapy including daily walks,  Increased utilization of assistance offered by mother, especially on the days her husband has a 24 hour shift

## 2020-03-31 LAB — COMPREHENSIVE METABOLIC PANEL
AG Ratio: 1.5 (calc) (ref 1.0–2.5)
ALT: 16 U/L (ref 6–29)
AST: 16 U/L (ref 10–30)
Albumin: 4 g/dL (ref 3.6–5.1)
Alkaline phosphatase (APISO): 113 U/L (ref 31–125)
BUN: 11 mg/dL (ref 7–25)
CO2: 30 mmol/L (ref 20–32)
Calcium: 9.6 mg/dL (ref 8.6–10.2)
Chloride: 104 mmol/L (ref 98–110)
Creat: 0.79 mg/dL (ref 0.50–1.10)
Globulin: 2.6 g/dL (calc) (ref 1.9–3.7)
Glucose, Bld: 91 mg/dL (ref 65–99)
Potassium: 4.1 mmol/L (ref 3.5–5.3)
Sodium: 142 mmol/L (ref 135–146)
Total Bilirubin: 0.3 mg/dL (ref 0.2–1.2)
Total Protein: 6.6 g/dL (ref 6.1–8.1)

## 2020-03-31 LAB — CBC WITH DIFFERENTIAL/PLATELET
Absolute Monocytes: 595 cells/uL (ref 200–950)
Basophils Absolute: 28 cells/uL (ref 0–200)
Basophils Relative: 0.4 %
Eosinophils Absolute: 182 cells/uL (ref 15–500)
Eosinophils Relative: 2.6 %
HCT: 40.5 % (ref 35.0–45.0)
Hemoglobin: 13.4 g/dL (ref 11.7–15.5)
Lymphs Abs: 1932 cells/uL (ref 850–3900)
MCH: 28.6 pg (ref 27.0–33.0)
MCHC: 33.1 g/dL (ref 32.0–36.0)
MCV: 86.5 fL (ref 80.0–100.0)
MPV: 10.4 fL (ref 7.5–12.5)
Monocytes Relative: 8.5 %
Neutro Abs: 4263 cells/uL (ref 1500–7800)
Neutrophils Relative %: 60.9 %
Platelets: 223 10*3/uL (ref 140–400)
RBC: 4.68 10*6/uL (ref 3.80–5.10)
RDW: 12.2 % (ref 11.0–15.0)
Total Lymphocyte: 27.6 %
WBC: 7 10*3/uL (ref 3.8–10.8)

## 2020-03-31 LAB — LIPID PANEL
Cholesterol: 174 mg/dL (ref <200)
HDL: 66 mg/dL (ref >=50)
LDL Cholesterol (Calc): 84 mg/dL (calc)
Non-HDL Cholesterol (Calc): 108 mg/dL (calc) (ref <130)
Total CHOL/HDL Ratio: 2.6 (calc) (ref <5.0)
Triglycerides: 140 mg/dL (ref <150)

## 2020-03-31 LAB — THYROID PANEL WITH TSH
Free Thyroxine Index: 2.1 (ref 1.4–3.8)
T3 Uptake: 27 % (ref 22–35)
T4, Total: 7.7 ug/dL (ref 5.1–11.9)
TSH: 1.47 mIU/L

## 2020-03-31 LAB — HEMOGLOBIN A1C
Hgb A1c MFr Bld: 4.9 % of total Hgb (ref <5.7)
Mean Plasma Glucose: 94 (calc)
eAG (mmol/L): 5.2 (calc)

## 2020-04-27 ENCOUNTER — Encounter: Payer: Self-pay | Admitting: Internal Medicine

## 2020-04-27 ENCOUNTER — Telehealth (INDEPENDENT_AMBULATORY_CARE_PROVIDER_SITE_OTHER): Payer: Managed Care, Other (non HMO) | Admitting: Internal Medicine

## 2020-04-27 DIAGNOSIS — O99345 Other mental disorders complicating the puerperium: Secondary | ICD-10-CM | POA: Diagnosis not present

## 2020-04-27 DIAGNOSIS — E669 Obesity, unspecified: Secondary | ICD-10-CM | POA: Diagnosis not present

## 2020-04-27 DIAGNOSIS — N6459 Other signs and symptoms in breast: Secondary | ICD-10-CM | POA: Diagnosis not present

## 2020-04-27 DIAGNOSIS — F53 Postpartum depression: Secondary | ICD-10-CM

## 2020-04-27 NOTE — Progress Notes (Signed)
Virtual Visit via Caregility  This visit type was conducted due to national recommendations for restrictions regarding the COVID-19 pandemic (e.g. social distancing).  This format is felt to be most appropriate for this patient at this time.  All issues noted in this document were discussed and addressed.  No physical exam was performed (except for noted visual exam findings with Video Visits).   I connected with@ on 04/27/20 at  8:00 AM EDT by a video enabled telemedicine application and verified that I am speaking with the correct person using two identifiers. Location patient: home Location provider: work or home office Persons participating in the virtual visit: patient, provider  I discussed the limitations, risks, security and privacy concerns of performing an evaluation and management service by telephone and the availability of in person appointments. I also discussed with the patient that there may be a patient responsible charge related to this service. The patient expressed understanding and agreed to proceed.   Reason for visit: follow up on treatment for depression   HPI:  29 yr old with postpartum depression aggravate by lack of sleep.  Started zoloft at 25 mg daily,  Increased it to 50 mg daily qhs with good results.  Baby sleeping longer since she supplements breast milk with formula.  Patient is now averaging about 5 hours of sleep (up from 3-4) and working full time as pharmacy tesch at [employer]   Recent episode of nipple bleeding due to irritation from using the breast pump container when she ran out of her usual bags.  Now resolved.  No signs of mastitis  Obesity:  Plans to resume baking/KETO diet. Eating habits already improving with improvement in depressive symptoms .    ROS: See pertinent positives and negatives per HPI.  Past Medical History:  Diagnosis Date  . Allergy   . Anxiety   . Hyperlipidemia LDL goal <100 03/06/2015  . Obesity (BMI 30-39.9)   .  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 Tachycardia 12/21/2015    Past Surgical History:  Procedure Laterality Date  . CESAREAN SECTION N/A 10/21/2019   Procedure: CESAREAN SECTION;  Surgeon: Hildred Laser, MD;  Location: [facility] ORS;  Service: Obstetrics;  Laterality: N/A;  . INCISION AND DRAINAGE ABSCESS N/A    abdomen  . TONSILLECTOMY      Family History  Problem Relation Age of Onset  . Hyperlipidemia Mother   . Hypertension Father   . Heart disease Father        on dad's side  . Thyroid disease Father   . Cancer Maternal Grandfather   . Thyroid disease Sister   . Breast cancer Maternal Aunt   . Thyroid disease Maternal Aunt     SOCIAL HX:  reports that she has never smoked. She has never used smokeless tobacco. She reports previous alcohol use. She reports that she does not use drugs.   Current Outpatient Medications:  .  Ascorbic Acid (HALLS DEFENSE VITAMIN C DROPS MT), Use as directed 2 tablets in the mouth or throat daily., Disp: , Rfl:  .  Cholecalciferol (VITAMIN D3) 50 MCG (2000 UT) TABS, Take 2,000 Units by mouth daily., Disp: , Rfl:  .  Prenat w/o A Vit-FeFum-FePo-FA (CONCEPT OB) 130-92.4-1 MG CAPS, Take 130 mg by mouth daily. (Patient taking differently: Take 1 capsule by mouth at bedtime. ), Disp: 30 capsule, Rfl: 11 .  sertraline (ZOLOFT) 50 MG tablet, Take 1 tablet (50 mg total) by mouth daily., Disp: 90 tablet, Rfl: 0 .  Zinc 50 MG TABS, Take 1 tablet by mouth daily., Disp: , Rfl:   EXAM:  VITALS per patient if applicable:  GENERAL: alert, oriented, appears well and in no acute distress  HEENT: atraumatic, conjunttiva clear, no obvious abnormalities on inspection of external nose and ears  NECK: normal movements of the head and neck  LUNGS: on inspection no signs of respiratory distress, breathing rate appears normal, no obvious gross SOB, gasping or wheezing  CV: no obvious cyanosis  MS: moves all visible extremities without noticeable  abnormality  PSYCH/NEURO: pleasant and cooperative, no obvious depression or anxiety, speech and thought processing grossly intact  ASSESSMENT AND PLAN:  Discussed the following assessment and plan:  Post partum depression  Bleeding from nipple in female  Obesity (BMI 35.0-39.9 without comorbidity)  Post partum depression Improving with sertraline and improved sleep routine.  Continue 50 mg daily.  Follow up 3 months   Bleeding from nipple in female Secondary to irritation from breast pump container.  Resolved,  But advised to use topical antibiotic if occurs again to avoid staph infection   Obesity (BMI 35.0-39.9 without comorbidity) Secondary to pregnancy weight gain and post partum weight gain due to depression.  KETO diet worked in the past; she is going to resume it     I discussed the assessment and treatment plan with the patient. The patient was provided an opportunity to ask questions and all were answered. The patient agreed with the plan and demonstrated an understanding of the instructions.   The patient was advised to call back or seek an in-person evaluation if the symptoms worsen or if the condition fails to improve as anticipated.   A total of 30 minutes was spent in a face to face encounter,  more than half of which was spent in counseling patient on the above mentioned issues , reviewing and explaining recent labs and imaging studies done, and coordination of care.   Sherlene Shams, MD

## 2020-04-27 NOTE — Assessment & Plan Note (Signed)
Improving with sertraline and improved sleep routine.  Continue 50 mg daily.  Follow up 3 months

## 2020-04-27 NOTE — Assessment & Plan Note (Signed)
Secondary to irritation from breast pump container.  Resolved,  But advised to use topical antibiotic if occurs again to avoid staph infection

## 2020-04-27 NOTE — Assessment & Plan Note (Signed)
Secondary to pregnancy weight gain and post partum weight gain due to depression.  KETO diet worked in the past; she is going to resume it

## 2020-06-06 ENCOUNTER — Encounter: Payer: Managed Care, Other (non HMO) | Admitting: Certified Nurse Midwife

## 2020-07-03 ENCOUNTER — Ambulatory Visit (INDEPENDENT_AMBULATORY_CARE_PROVIDER_SITE_OTHER): Payer: Managed Care, Other (non HMO) | Admitting: Certified Nurse Midwife

## 2020-07-03 ENCOUNTER — Other Ambulatory Visit: Payer: Self-pay

## 2020-07-03 ENCOUNTER — Other Ambulatory Visit (HOSPITAL_COMMUNITY)
Admission: RE | Admit: 2020-07-03 | Discharge: 2020-07-03 | Disposition: A | Discharge status: Home / Self Care | Payer: Managed Care, Other (non HMO) | Source: Ambulatory Visit | Attending: Certified Nurse Midwife | Admitting: Certified Nurse Midwife

## 2020-07-03 ENCOUNTER — Encounter: Payer: Self-pay | Admitting: Certified Nurse Midwife

## 2020-07-03 VITALS — BP 94/63 | HR 84 | Ht 63.0 in | Wt 231.6 lb

## 2020-07-03 DIAGNOSIS — Z01419 Encounter for gynecological examination (general) (routine) without abnormal findings: Secondary | ICD-10-CM | POA: Insufficient documentation

## 2020-07-03 DIAGNOSIS — Z23 Encounter for immunization: Secondary | ICD-10-CM | POA: Diagnosis not present

## 2020-07-03 DIAGNOSIS — Z124 Encounter for screening for malignant neoplasm of cervix: Secondary | ICD-10-CM

## 2020-07-03 NOTE — Progress Notes (Signed)
GYNECOLOGY ANNUAL PREVENTATIVE CARE ENCOUNTER NOTE  History:     Veronica Hurst is a 29 y.o. G51P1001 female here for a routine annual gynecologic exam.  Current complaints: none.   Denies abnormal vaginal bleeding, discharge, pelvic pain, problems with intercourse or other gynecologic concerns.     Social Relationship:Married Living:spouse and her son  Work: Associate Professor Exercise: not currently  Smoke/Alcohol/drug use: denies  Gynecologic History Patient's last menstrual period was 06/04/2020 (approximate). Contraception: none Last Pap:9/4/20018. Results were: normal  Last mammogram:n/a    Upstream - 07/03/20 1459      Pregnancy Intention Screening   Does the patient want to become pregnant in the next year? No    Does the patient's partner want to become pregnant in the next year? No    Would the patient like to discuss contraceptive options today? No      Contraception Wrap Up   Contraception Counseling Provided No          The pregnancy intention screening data noted above was reviewed. Potential methods of contraception were discussed. The patient elected to proceed with Abstinence.    Obstetric History OB History  Gravida Para Term Preterm AB Living  1 1 1  0 0 1  SAB TAB Ectopic Multiple Live Births  0 0 0 0 1    # Outcome Date GA Lbr Len/2nd Weight Sex Delivery Anes PTL Lv  1 Term 10/21/19 [redacted]w[redacted]d  6 lb 11.9 oz (3.06 kg) M CS-LTranv Spinal  LIV    Past Medical History:  Diagnosis Date   Allergy    Anxiety    Hyperlipidemia LDL goal <100 03/06/2015   Obesity (BMI 30-39.9)    Post partum depression 03/30/2020   Tachycardia 12/21/2015    Past Surgical History:  Procedure Laterality Date   CESAREAN SECTION N/A 10/21/2019   Procedure: CESAREAN SECTION;  Surgeon: 12/21/2019, MD;  Location: [facility] ORS;  Service: Obstetrics;  Laterality: N/A;   INCISION AND DRAINAGE ABSCESS N/A    abdomen   TONSILLECTOMY      Current Outpatient Medications on  File Prior to Visit  Medication Sig Dispense Refill   Cholecalciferol (VITAMIN D3) 50 MCG (2000 UT) TABS Take 2,000 Units by mouth daily.     sertraline (ZOLOFT) 50 MG tablet Take 1 tablet (50 mg total) by mouth daily. 90 tablet 0   Zinc 50 MG TABS Take 1 tablet by mouth daily.     Prenat w/o A Vit-FeFum-FePo-FA (CONCEPT OB) 130-92.4-1 MG CAPS Take 130 mg by mouth daily. (Patient not taking: Reported on 07/03/2020) 30 capsule 11   No current facility-administered medications on file prior to visit.    No Known Allergies  Social History:  reports that she has never smoked. She has never used smokeless tobacco. She reports previous alcohol use. She reports that she does not use drugs.  Family History  Problem Relation Age of Onset   Hyperlipidemia Mother    Hypertension Father    Heart disease Father        on dad's side   Thyroid disease Father    Cancer Maternal Grandfather    Thyroid disease Sister    Breast cancer Maternal Aunt    Thyroid disease Maternal Aunt     The following portions of the patient's history were reviewed and updated as appropriate: allergies, current medications, past family history, past medical history, past social history, past surgical history and problem list.  Review of Systems Pertinent items noted in  HPI and remainder of comprehensive ROS otherwise negative.  Physical Exam:  BP 94/63    Pulse 84    Ht 5\' 3"  (1.6 m)    Wt 231 lb 9 oz (105 kg)    LMP 06/04/2020 (Approximate)    Breastfeeding Yes    BMI 41.02 kg/m  CONSTITUTIONAL: Well-developed, well-nourished female in no acute distress.  HENT:  Normocephalic, atraumatic, External right and left ear normal. Oropharynx is clear and moist EYES: Conjunctivae and EOM are normal. Pupils are equal, round, and reactive to light. No scleral icterus.  NECK: Normal range of motion, supple, no masses.  Normal thyroid.  SKIN: Skin is warm and dry. No rash noted. Not diaphoretic. No erythema. No  pallor. MUSCULOSKELETAL: Normal range of motion. No tenderness.  No cyanosis, clubbing, or edema.  2+ distal pulses. NEUROLOGIC: Alert and oriented to person, place, and time. Normal reflexes, muscle tone coordination.  PSYCHIATRIC: Normal mood and affect. Normal behavior. Normal judgment and thought content. CARDIOVASCULAR: Normal heart rate noted, regular rhythm RESPIRATORY: Clear to auscultation bilaterally. Effort and breath sounds normal, no problems with respiration noted. BREASTS: Symmetric in size. No masses, tenderness, skin changes, nipple drainage, or lymphadenopathy bilaterally.  ABDOMEN: Soft, no distention noted.  No tenderness, rebound or guarding.  PELVIC: Normal appearing external genitalia and urethral meatus; normal appearing vaginal mucosa and cervix.  No abnormal discharge noted.  Pap smear obtained.  Normal uterine size, no other palpable masses, no uterine or adnexal tenderness.  .   Assessment and Plan:    1. Need for immunization against influenza   2. Women's annual routine gynecological examination   Pap:Will follow up results of pap smear and manage accordingly. Mammogram :n/a  Labs:declines Refills: none Referral: none Preventative health maintenance measures emphasized. Please refer to After Visit Summary for other counseling recommendations.      06/06/2020, CNM [facility],  [facility]

## 2020-07-03 NOTE — Patient Instructions (Signed)
Preventive Care 21-28 Years Old, Female Preventive care refers to visits with your health care provider and lifestyle choices that can promote health and wellness. This includes:  A yearly physical exam. This may also be called an annual well check.  Regular dental visits and eye exams.  Immunizations.  Screening for certain conditions.  Healthy lifestyle choices, such as eating a healthy diet, getting regular exercise, not using drugs or products that contain nicotine and tobacco, and limiting alcohol use. What can I expect for my preventive care visit? Physical exam Your health care provider will check your:  Height and weight. This may be used to calculate body mass index (BMI), which tells if you are at a healthy weight.  Heart rate and blood pressure.  Skin for abnormal spots. Counseling Your health care provider may ask you questions about your:  Alcohol, tobacco, and drug use.  Emotional well-being.  Home and relationship well-being.  Sexual activity.  Eating habits.  Work and work environment.  Method of birth control.  Menstrual cycle.  Pregnancy history. What immunizations do I need?  Influenza (flu) vaccine  This is recommended every year. Tetanus, diphtheria, and pertussis (Tdap) vaccine  You may need a Td booster every 10 years. Varicella (chickenpox) vaccine  You may need this if you have not been vaccinated. Human papillomavirus (HPV) vaccine  If recommended by your health care provider, you may need three doses over 6 months. Measles, mumps, and rubella (MMR) vaccine  You may need at least one dose of MMR. You may also need a second dose. Meningococcal conjugate (MenACWY) vaccine  One dose is recommended if you are age 19-21 years and a first-year college student living in a residence hall, or if you have one of several medical conditions. You may also need additional booster doses. Pneumococcal conjugate (PCV13) vaccine  You may need  this if you have certain conditions and were not previously vaccinated. Pneumococcal polysaccharide (PPSV23) vaccine  You may need one or two doses if you smoke cigarettes or if you have certain conditions. Hepatitis A vaccine  You may need this if you have certain conditions or if you travel or work in places where you may be exposed to hepatitis A. Hepatitis B vaccine  You may need this if you have certain conditions or if you travel or work in places where you may be exposed to hepatitis B. Haemophilus influenzae type b (Hib) vaccine  You may need this if you have certain conditions. You may receive vaccines as individual doses or as more than one vaccine together in one shot (combination vaccines). Talk with your health care provider about the risks and benefits of combination vaccines. What tests do I need?  Blood tests  Lipid and cholesterol levels. These may be checked every 5 years starting at age 20.  Hepatitis C test.  Hepatitis B test. Screening  Diabetes screening. This is done by checking your blood sugar (glucose) after you have not eaten for a while (fasting).  Sexually transmitted disease (STD) testing.  BRCA-related cancer screening. This may be done if you have a family history of breast, ovarian, tubal, or peritoneal cancers.  Pelvic exam and Pap test. This may be done every 3 years starting at age 21. Starting at age 30, this may be done every 5 years if you have a Pap test in combination with an HPV test. Talk with your health care provider about your test results, treatment options, and if necessary, the need for more tests.   Follow these instructions at home: Eating and drinking   Eat a diet that includes fresh fruits and vegetables, whole grains, lean protein, and low-fat dairy.  Take vitamin and mineral supplements as recommended by your health care provider.  Do not drink alcohol if: ? Your health care provider tells you not to drink. ? You are  pregnant, may be pregnant, or are planning to become pregnant.  If you drink alcohol: ? Limit how much you have to 0-1 drink a day. ? Be aware of how much alcohol is in your drink. In the [place] one drink equals one 12 oz bottle of beer (355 mL), one 5 oz glass of wine (148 mL), or one 1 oz glass of hard liquor (44 mL). Lifestyle  Take daily care of your teeth and gums.  Stay active. Exercise for at least 30 minutes on 5 or more days each week.  Do not use any products that contain nicotine or tobacco, such as cigarettes, e-cigarettes, and chewing tobacco. If you need help quitting, ask your health care provider.  If you are sexually active, practice safe sex. Use a condom or other form of birth control (contraception) in order to prevent pregnancy and STIs (sexually transmitted infections). If you plan to become pregnant, see your health care provider for a preconception visit. What's next?  Visit your health care provider once a year for a well check visit.  Ask your health care provider how often you should have your eyes and teeth checked.  Stay up to date on all vaccines. This information is not intended to replace advice given to you by your health care provider. Make sure you discuss any questions you have with your health care provider. Document Revised: 04/15/2018 Document Reviewed: 04/15/2018 Elsevier Patient Education  2020 [employer].

## 2020-07-06 LAB — CYTOLOGY - PAP: Diagnosis: NEGATIVE

## 2020-07-19 ENCOUNTER — Other Ambulatory Visit: Payer: Self-pay | Admitting: Internal Medicine

## 2020-07-30 ENCOUNTER — Encounter: Payer: Self-pay | Admitting: Internal Medicine

## 2020-07-30 ENCOUNTER — Telehealth (INDEPENDENT_AMBULATORY_CARE_PROVIDER_SITE_OTHER): Payer: Managed Care, Other (non HMO) | Admitting: Internal Medicine

## 2020-07-30 DIAGNOSIS — O99345 Other mental disorders complicating the puerperium: Secondary | ICD-10-CM | POA: Diagnosis not present

## 2020-07-30 DIAGNOSIS — F53 Postpartum depression: Secondary | ICD-10-CM

## 2020-07-30 MED ORDER — SERTRALINE HCL 50 MG PO TABS: 75.0000 mg | ORAL_TABLET | Freq: Every day | ORAL | 1 refills | Status: DC

## 2020-07-30 NOTE — Progress Notes (Signed)
Virtual Visit converted to Telephone Note  This visit type was conducted due to national recommendations for restrictions regarding the COVID-19 pandemic (e.g. social distancing).  This format is felt to be most appropriate for this patient at this time.  All issues noted in this document were discussed and addressed.  No physical exam was performed (except for noted visual exam findings with Video Visits).   I attempted to connect with@ on 07/30/20 at  8:00 AM EST by a video enabled telemedicine application  and verified that I am speaking with the correct person using two identifiers. Location patient: home Location provider: work or home office Persons participating in the virtual visit: patient, provider  I discussed the limitations, risks, security and privacy concerns of performing an evaluation and management service by telephone and the availability of in person appointments. I also discussed with the patient that there may be a patient responsible charge related to this service. The patient expressed understanding and agreed to proceed.  Interactive audio and video telecommunications were attempted between this provider and patient, however failed, due to patient having technical difficulties.  We continued and completed visit with audio only.   Reason for visit: FOLLOW UP ON POST PARTUM DEPRESSION   HPI:  29 yr old  Wit PPD managed with sertraline.  She feels generally well,  Still working full time  Son Is 70 months old and in daycare. Increased dose to 75 mg recently following the death of her grandfather who died In nursing home of complications from  Pneumonia and dementia .  They were very close for years , and her son was able to have a relationship with her grandfather before he passed.    Considering becoming a full time mother .   ROS: See pertinent positives and negatives per HPI.  Past Medical History:  Diagnosis Date  . Allergy   . Anxiety   . Hyperlipidemia LDL goal  <100 03/06/2015  . Obesity (BMI 30-39.9)   . 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 Tachycardia 12/21/2015    Past Surgical History:  Procedure Laterality Date  . CESAREAN SECTION N/A 10/21/2019   Procedure: CESAREAN SECTION;  Surgeon: Hildred Laser, MD;  Location: [facility] ORS;  Service: Obstetrics;  Laterality: N/A;  . INCISION AND DRAINAGE ABSCESS N/A    abdomen  . TONSILLECTOMY      Family History  Problem Relation Age of Onset  . Hyperlipidemia Mother   . Hypertension Father   . Heart disease Father        on dad's side  . Thyroid disease Father   . Cancer Maternal Grandfather   . Thyroid disease Sister   . Breast cancer Maternal Aunt   . Thyroid disease Maternal Aunt     SOCIAL HX:  reports that she has never smoked. She has never used smokeless tobacco. She reports previous alcohol use. She reports that she does not use drugs.   Current Outpatient Medications:  .  Ascorbic Acid (VITAMIN C) 1000 MG tablet, Take 1,000 mg by mouth daily., Disp: , Rfl:  .  Cholecalciferol (VITAMIN D3) 50 MCG (2000 UT) TABS, Take 2,000 Units by mouth daily., Disp: , Rfl:  .  Prenat w/o A Vit-FeFum-FePo-FA (CONCEPT OB) 130-92.4-1 MG CAPS, Take 130 mg by mouth daily., Disp: 30 capsule, Rfl: 11 .  Zinc 50 MG TABS, Take 1 tablet by mouth daily., Disp: , Rfl:  .  sertraline (ZOLOFT) 50 MG tablet, Take 1.5 tablets (75 mg total) by mouth daily., Disp:  135 tablet, Rfl: 1  EXAM:  VITALS per patient if applicable:  GENERAL: alert, oriented, appears well and in no acute distress  HEENT: atraumatic, conjunttiva clear, no obvious abnormalities on inspection of external nose and ears  NECK: normal movements of the head and neck  LUNGS: on inspection no signs of respiratory distress, breathing rate appears normal, no obvious gross SOB, gasping or wheezing  CV: no obvious cyanosis  MS: moves all visible extremities without noticeable abnormality  PSYCH/NEURO: pleasant and cooperative, no obvious  depression or anxiety, speech and thought processing grossly intact  ASSESSMENT AND PLAN:  Discussed the following assessment and plan:  Post partum depression  Post partum depression Improved with higher dose of sertraline (75 mg daily).  She recently lost her beloved grandfather but does not feel that she has processed the loss yet because of her busy schedule and infant.  has adequate coping skills and emotional support .  i have asked patient to contact me if she becomes more anxious or depressed over the holidays     I discussed the assessment and treatment plan with the patient. The patient was provided an opportunity to ask questions and all were answered. The patient agreed with the plan and demonstrated an understanding of the instructions.   The patient was advised to call back or seek an in-person evaluation if the symptoms worsen or if the condition fails to improve as anticipated.  I provided 20 minutes of non-face-to-face time during this encounter.   Sherlene Shams, MD

## 2020-07-30 NOTE — Assessment & Plan Note (Signed)
Improved with higher dose of sertraline (75 mg daily).  She recently lost her beloved grandfather but does not feel that she has processed the loss yet because of her busy schedule and infant.  has adequate coping skills and emotional support .  i have asked patient to contact me if she becomes more anxious or depressed over the holidays

## 2020-08-05 ENCOUNTER — Other Ambulatory Visit: Payer: Self-pay | Admitting: Internal Medicine

## 2020-08-05 MED ORDER — BENZONATATE 100 MG PO CAPS: 100.0000 mg | ORAL_CAPSULE | Freq: Two times a day (BID) | ORAL | 0 refills | Status: DC | PRN

## 2021-06-23 IMAGING — US US OB LIMITED
1 series · 14 of 26 positions shown · non-contrast
Comparison: none

CLINICAL DATA: Pregnant patient.  Evaluate fetal presentation.

EXAM:
LIMITED OBSTETRIC ULTRASOUND

[Series 1: us ob limited · 26 acquisitions, 14 frames shown]
[im 1/26]
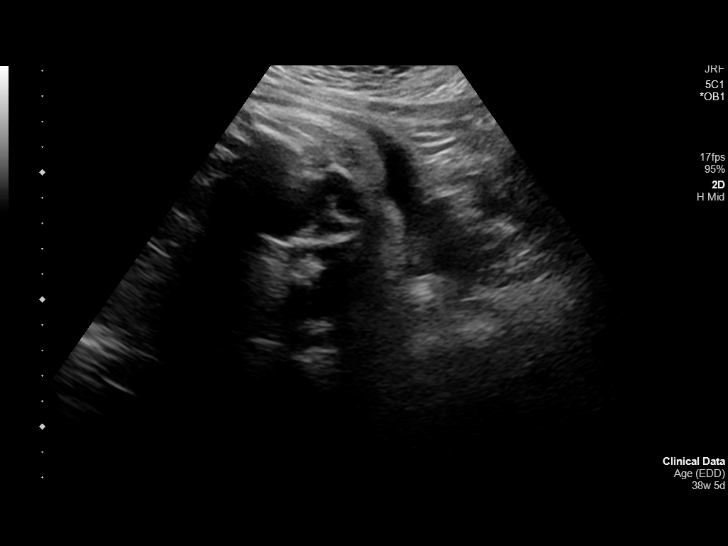
[im 3/26]
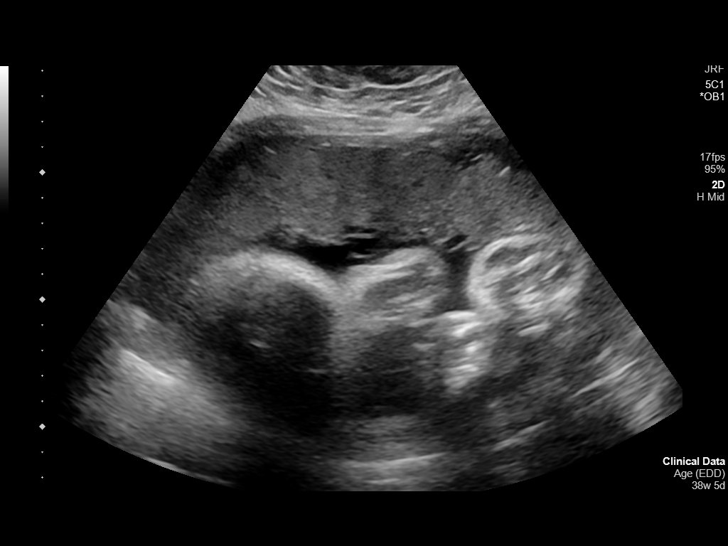
[im 5/26]
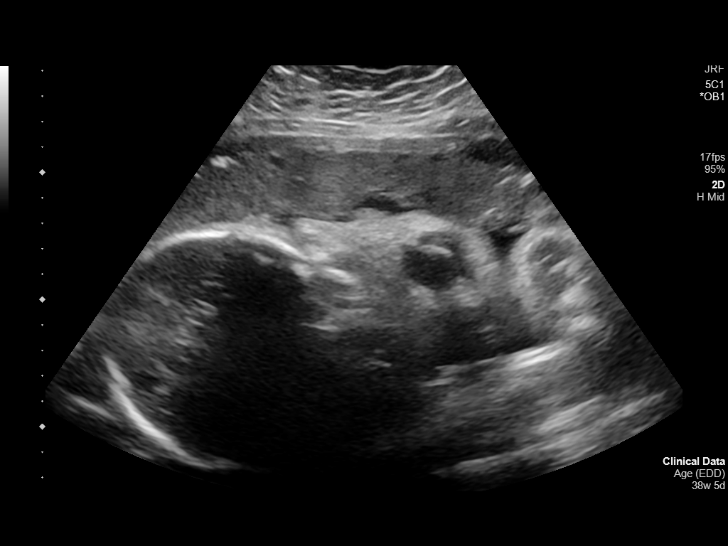
[im 7/26]
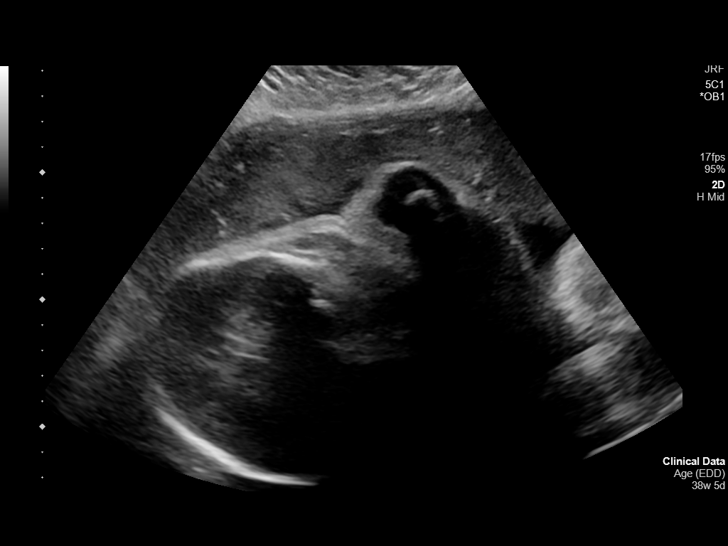
[im 9/26]
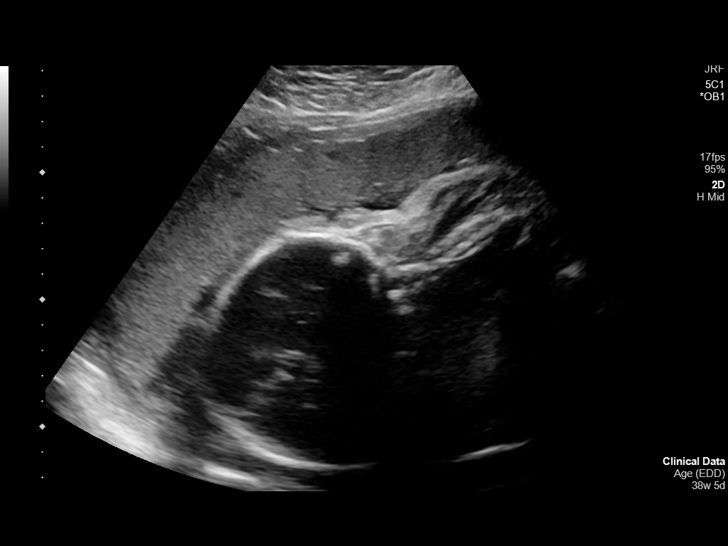
[im 11/26]
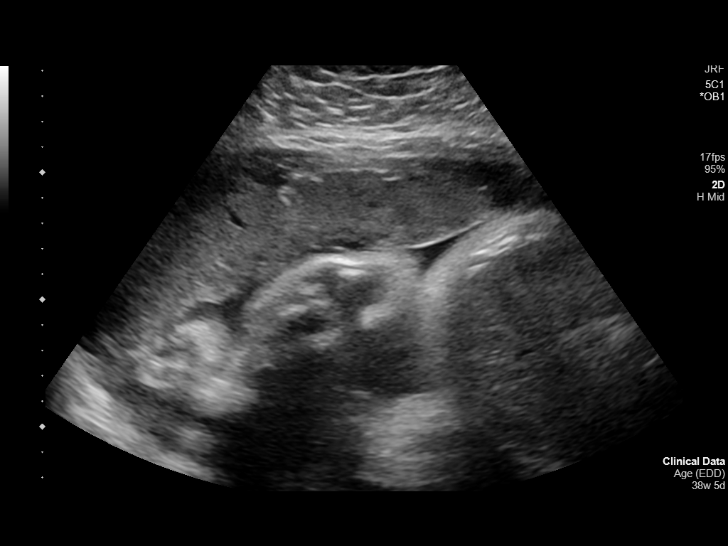
[im 13/26]
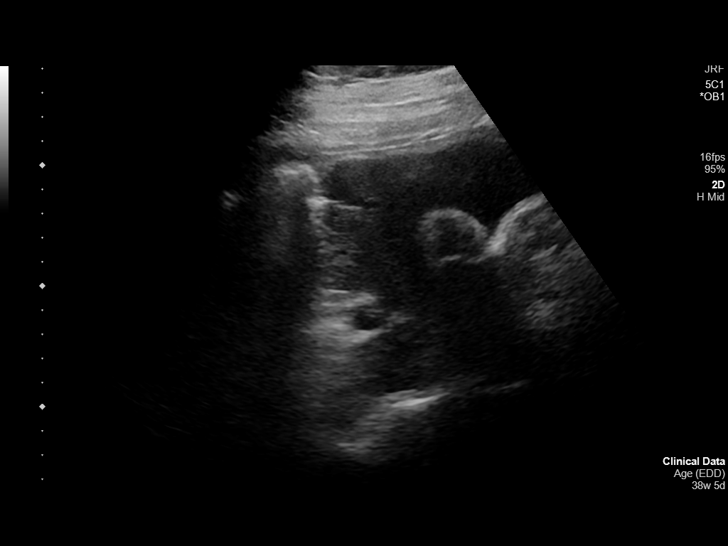
[im 14/26]
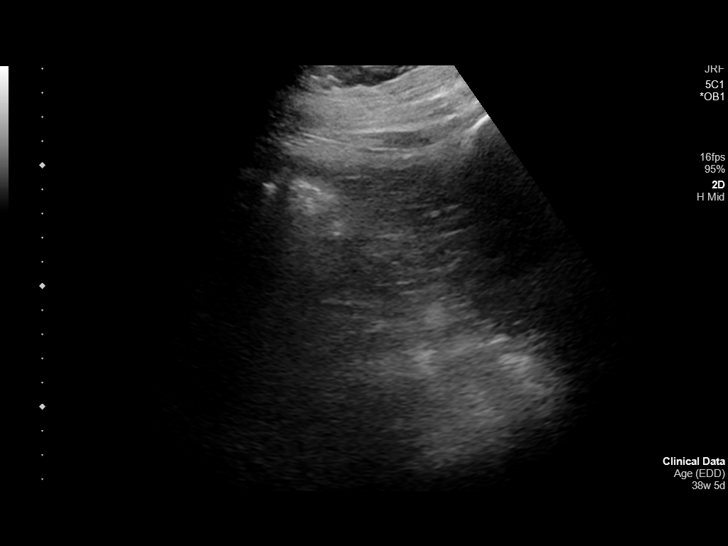
[im 16/26]
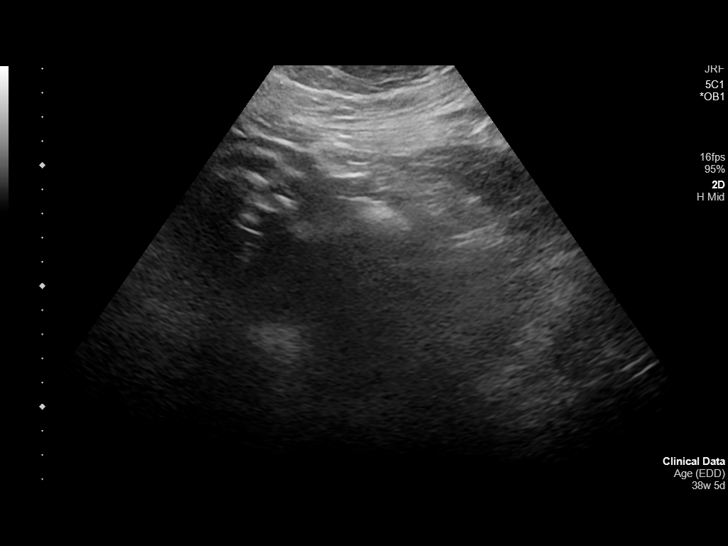
[im 18/26]
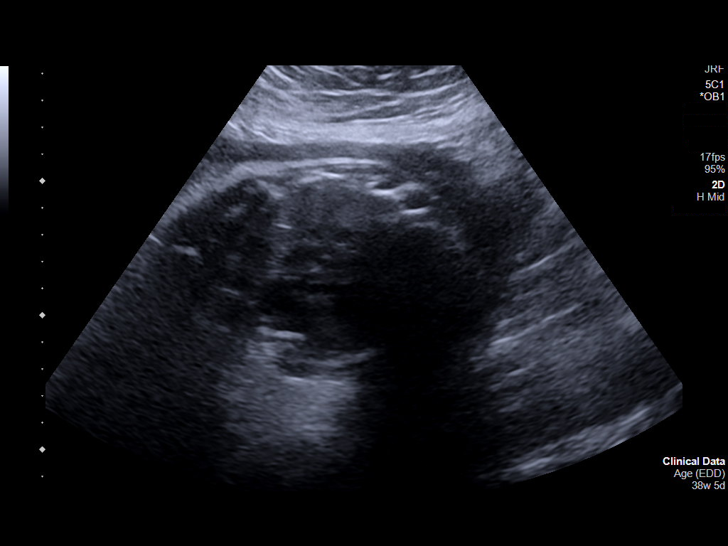
[im 20/26]
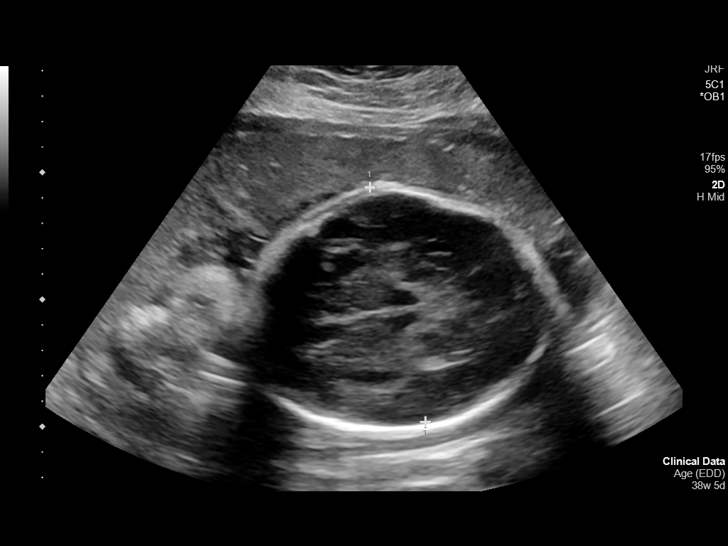
[im 22/26]
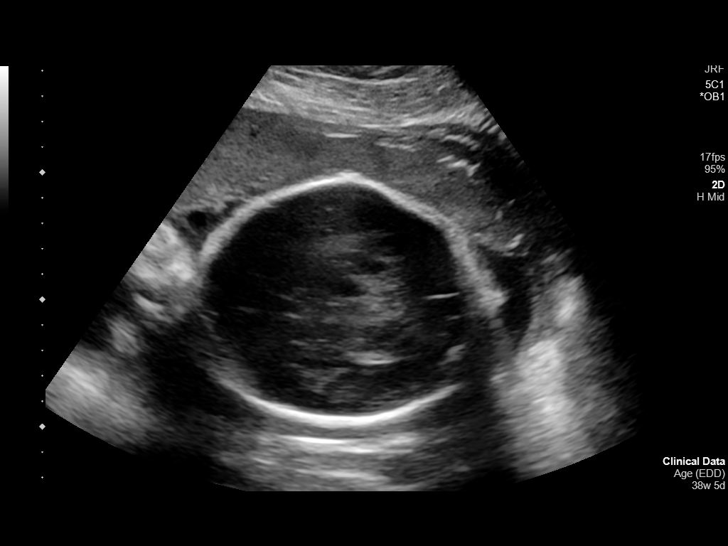
[im 24/26]
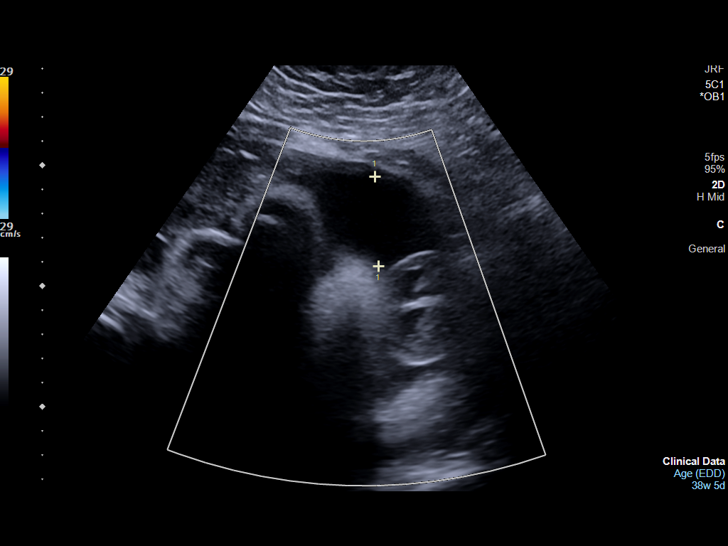
[im 26/26]
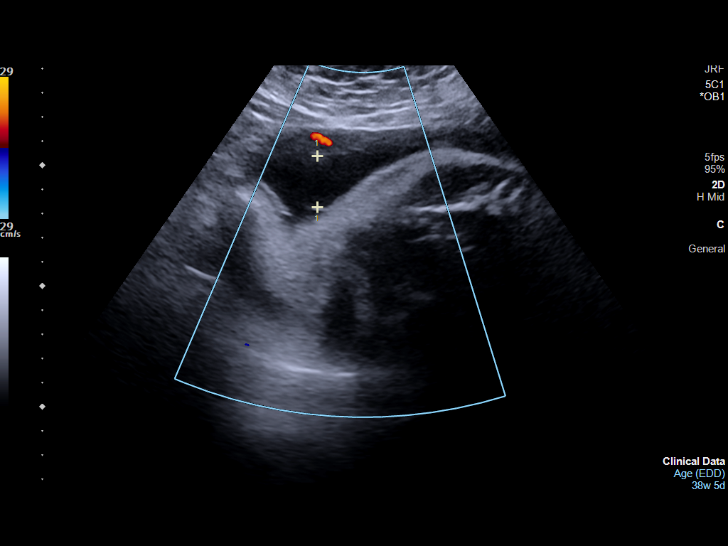

[14 of 26 positions shown; findings below may reference images not displayed]

FINDINGS: Number of Fetuses: 1

Heart Rate:  135 bpm

Movement: Visualized.

Presentation: Breech.

Placental Location: Anterior.

Previa: No.

Amniotic Fluid (Subjective):  Within normal limits.

AFI: 8.3 cm

BPD: 9.5  cm 38 w  5 d

MATERNAL FINDINGS:

Cervix:  Appears closed.

Uterus/Adnexae: No abnormality visualized.
IMPRESSION: Single living intrauterine pregnancy.  Breech presentation.

This exam is performed on an emergent basis and does not
comprehensively evaluate fetal size, dating, or anatomy; follow-up
complete OB US should be considered if further fetal assessment is
warranted.

## 2021-07-10 ENCOUNTER — Ambulatory Visit (INDEPENDENT_AMBULATORY_CARE_PROVIDER_SITE_OTHER): Payer: Managed Care, Other (non HMO) | Admitting: Certified Nurse Midwife

## 2021-07-10 ENCOUNTER — Other Ambulatory Visit: Payer: Self-pay

## 2021-07-10 ENCOUNTER — Encounter: Payer: Self-pay | Admitting: Certified Nurse Midwife

## 2021-07-10 VITALS — BP 123/60 | HR 84 | Ht 63.0 in | Wt 242.6 lb

## 2021-07-10 DIAGNOSIS — Z01419 Encounter for gynecological examination (general) (routine) without abnormal findings: Secondary | ICD-10-CM | POA: Diagnosis not present

## 2021-07-10 DIAGNOSIS — Z23 Encounter for immunization: Secondary | ICD-10-CM

## 2021-07-10 MED ORDER — SULFAMETHOXAZOLE-TRIMETHOPRIM 800-160 MG PO TABS: 1.0000 | ORAL_TABLET | Freq: Every day | ORAL | 2 refills | Status: AC

## 2021-07-10 MED ORDER — DOXYCYCLINE HYCLATE 100 MG PO TABS: 100.0000 mg | ORAL_TABLET | Freq: Every day | ORAL | 2 refills | Status: DC

## 2021-07-10 NOTE — Patient Instructions (Signed)

## 2021-07-10 NOTE — Progress Notes (Signed)
GYNECOLOGY ANNUAL PREVENTATIVE CARE ENCOUNTER NOTE  History:     Veronica Hurst is a 30 y.o. G46P1001 female here for a routine annual gynecologic exam.  Current complaints: none.   Denies abnormal vaginal bleeding, discharge, pelvic pain, problems with intercourse or other gynecologic concerns.     Social Relationship: married Living: spouse and child  Work: Associate Professor. Exercise: none Smoke/Alcohol/drug use: denies use  Gynecologic History Patient's last menstrual period was 06/12/2021 (exact date). Contraception: none Last Pap: 06/23/2020. Results were: normal  Last mammogram: n/a.    Upstream - 07/10/21 1423       Pregnancy Intention Screening   Does the patient want to become pregnant in the next year? No    Does the patient's partner want to become pregnant in the next year? No    Would the patient like to discuss contraceptive options today? No      Contraception Wrap Up   Current Method No Method - Other Reason            The pregnancy intention screening data noted above was reviewed. Potential methods of contraception were discussed. The patient elected to proceed with none.   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     Cetirizine HCl (ZYRTEC ALLERGY PO) Take by mouth.     Cholecalciferol (VITAMIN D3) 50 MCG (2000 UT) TABS Take 2,000 Units by mouth daily.     Zinc 50 MG TABS Take 1 tablet by mouth daily.     benzonatate (TESSALON) 100 MG capsule Take 1 capsule (100 mg total) by mouth 2 (two) times daily as needed for cough. 20 capsule 0   Prenat w/o A Vit-FeFum-FePo-FA (CONCEPT OB) 130-92.4-1 MG CAPS Take 130 mg by mouth daily. (Patient not taking: Reported on 07/10/2021) 30 capsule 11   sertraline (ZOLOFT) 50 MG tablet Take 1.5 tablets (75 mg total) by mouth daily. (Patient not taking: Reported on 07/10/2021) 135 tablet 1   No current facility-administered medications on file prior to visit.    No Known Allergies  Social History:  reports that she has never smoked. She has never used smokeless tobacco. She reports that she does not currently use alcohol. She reports that she does not use drugs.  Family History  Problem Relation Age of Onset   Hyperlipidemia Mother    Hypertension Father    Heart disease Father        on dad's side  Thyroid disease Father    Cancer Maternal Grandfather    Thyroid disease Sister    Breast cancer Maternal Aunt    Thyroid disease Maternal Aunt     The following portions of the patient's history were reviewed and updated as appropriate: allergies, current medications, past family history, past medical history, past social history, past surgical history and problem list.  Review of Systems Pertinent items noted in HPI and remainder of comprehensive ROS otherwise negative.  Physical Exam:  BP 123/60   Pulse 84   Ht 5\' 3"  (1.6 m)   Wt 242 lb 9.6 oz (110 kg)   LMP 06/12/2021 (Exact Date)   Breastfeeding No   BMI 42.97 kg/m  CONSTITUTIONAL: Well-developed, well-nourished female in no acute distress.  HENT:  Normocephalic, atraumatic, External  right and left ear normal. Oropharynx is clear and moist EYES: Conjunctivae and EOM are normal. Pupils are equal, round, and reactive to light. No scleral icterus.  NECK: Normal range of motion, supple, no masses.  Normal thyroid.  SKIN: Skin is warm and dry. No rash noted. Not diaphoretic. No erythema. No pallor. MUSCULOSKELETAL: Normal range of motion. No tenderness.  No cyanosis, clubbing, or edema.  2+ distal pulses. NEUROLOGIC: Alert and oriented to person, place, and time. Normal reflexes, muscle tone coordination.  PSYCHIATRIC: Normal mood and affect. Normal behavior. Normal judgment and thought content. CARDIOVASCULAR: Normal heart rate noted, regular rhythm RESPIRATORY: Clear to auscultation bilaterally. Effort and breath sounds normal, no problems with respiration noted. BREASTS: Symmetric in size. No masses, tenderness, skin changes, nipple drainage, or lymphadenopathy bilaterally.  ABDOMEN: Soft, no distention noted.  No tenderness, rebound or guarding.  PELVIC: Normal appearing external genitalia and urethral meatus; normal appearing vaginal mucosa and cervix.  No abnormal discharge noted.  Pap smear obtained.  Normal uterine size, no other palpable masses, no uterine or adnexal tenderness.  .   Assessment and Plan:    1. Women's annual routine gynecological examination   Pap: not due Mammogram : n/s  Labs:declines Flu vaccine today  Refills: Bactrim and doxycycline for skin /acne ( gets from dermatologist refill given for 3 months until she has her next derm appointment). Referral: none  Routine preventative health maintenance measures emphasized. Please refer to After Visit Summary for other counseling recommendations.      Concerned about weight. Discussed weigh loss program has used phentermine in the past which cause her heart to race is interested in alternative [place]. Will review and let pt know if we can try this .    06/14/2021, CNM [facility],  [facility]

## 2021-10-03 ENCOUNTER — Encounter: Payer: Self-pay | Admitting: Internal Medicine

## 2021-10-03 ENCOUNTER — Other Ambulatory Visit: Payer: Self-pay | Admitting: Internal Medicine

## 2021-10-03 DIAGNOSIS — R635 Abnormal weight gain: Secondary | ICD-10-CM

## 2021-10-03 DIAGNOSIS — R5383 Other fatigue: Secondary | ICD-10-CM

## 2021-10-03 DIAGNOSIS — R7301 Impaired fasting glucose: Secondary | ICD-10-CM

## 2021-10-08 ENCOUNTER — Ambulatory Visit: Payer: Managed Care, Other (non HMO) | Admitting: Internal Medicine

## 2021-10-08 ENCOUNTER — Encounter: Payer: Self-pay | Admitting: Internal Medicine

## 2021-10-08 ENCOUNTER — Other Ambulatory Visit: Payer: Self-pay

## 2021-10-08 DIAGNOSIS — R7301 Impaired fasting glucose: Secondary | ICD-10-CM | POA: Diagnosis not present

## 2021-10-08 DIAGNOSIS — R635 Abnormal weight gain: Secondary | ICD-10-CM | POA: Diagnosis not present

## 2021-10-08 DIAGNOSIS — R5383 Other fatigue: Secondary | ICD-10-CM | POA: Diagnosis not present

## 2021-10-08 MED ORDER — TIRZEPATIDE 2.5 MG/0.5ML ~~LOC~~ SOAJ
2.5000 mg | SUBCUTANEOUS | 2 refills | Status: DC
Start: 2021-10-08 — End: 2021-10-30

## 2021-10-08 NOTE — Patient Instructions (Signed)
Don't pick up the mounjaro until the labs have finalized and the TSH is normal range   The dose for the first 4 weekly doses is 2.5 mg .  You may have mild nausea on the first or second day but this should resolve.  If not  ,  stop the medication.   As long as you are losing weight,  you can continue the dose you are on .   FOR YOUR REFILLS,  SEND ME A MYCHART MESSAGE WITH THE FOLLOWING INFORMATION:  1)  the dose you are currently taking (2.5 mg for example)  2) whether or not you have lost weight in the preceding week (has your weight plateaued?)  I recommend that you  increase the dose every  4 weeks ONLY if your weight has plateaued.

## 2021-10-08 NOTE — Assessment & Plan Note (Signed)
Screening labs ordered. Depression screen positive.  She defers therapy for now in favor of managing her obesity

## 2021-10-08 NOTE — Progress Notes (Signed)
Subjective:  Patient ID: Veronica Hurst, female    DOB: 1990/10/15  Age: 31 y.o. MRN: [id number]  CC: Diagnoses of Other fatigue, Impaired fasting glucose, Weight gain, and Morbid obesity ([place]) were pertinent to this visit.   This visit occurred during the SARS-CoV-2 public health emergency.  Safety protocols were in place, including screening questions prior to the visit, additional usage of staff PPE, and extensive cleaning of exam room while observing appropriate contact time as indicated for disinfecting solutions.    HPI CHYANE CAMIRE presents for  Chief Complaint  Patient presents with   Follow-up    Discuss weight loss medication options and check thyroid.     Fatigue, weight gain.  Irritability .    Weight trajectory over the last  3 years.   She reduced weight to  to 165 from 228 using KETO diet  .  After C section weight 230 lbs.  Weight gain since  New  job Oct 2022,  despite working out 3 days per week with a Physiological scientist,  gained weight   so stopped paying for trainer.  Current job is less active.  sitting most of the day,  8:30 to 5:00    1 child 2 yrs old, 2 dogs,  husband gone for 24 hours at time as a Airline pilot  3 of 7 days per week   Feels irritable and overwhelmed.  Gets frustrated when scale doesn't move.     Outpatient Medications Prior to Visit  Medication Sig Dispense Refill   doxycycline (VIBRA-TABS) 100 MG tablet Take 1 tablet (100 mg total) by mouth daily. 30 tablet 2   sulfamethoxazole-trimethoprim (BACTRIM) 400-80 MG tablet Take 1 tablet by mouth 2 (two) times daily.     Ascorbic Acid (VITAMIN C) 1000 MG tablet Take 1,000 mg by mouth daily. (Patient not taking: Reported on 10/08/2021)     benzonatate (TESSALON) 100 MG capsule Take 1 capsule (100 mg total) by mouth 2 (two) times daily as needed for cough. (Patient not taking: Reported on 10/08/2021) 20 capsule 0   Cetirizine HCl (ZYRTEC ALLERGY PO) Take by mouth. (Patient not taking: Reported on  10/08/2021)     Cholecalciferol (VITAMIN D3) 50 MCG (2000 UT) TABS Take 2,000 Units by mouth daily. (Patient not taking: Reported on 10/08/2021)     Prenat w/o A Vit-FeFum-FePo-FA (CONCEPT OB) 130-92.4-1 MG CAPS Take 130 mg by mouth daily. (Patient not taking: Reported on 07/10/2021) 30 capsule 11   sertraline (ZOLOFT) 50 MG tablet Take 1.5 tablets (75 mg total) by mouth daily. (Patient not taking: Reported on 07/10/2021) 135 tablet 1   Zinc 50 MG TABS Take 1 tablet by mouth daily. (Patient not taking: Reported on 10/08/2021)     No facility-administered medications prior to visit.    Review of Systems;  Patient denies headache, fevers, malaise, unintentional weight loss, skin rash, eye pain, sinus congestion and sinus pain, sore throat, dysphagia,  hemoptysis , cough, dyspnea, wheezing, chest pain, palpitations, orthopnea, edema, abdominal pain, nausea, melena, diarrhea, constipation, flank pain, dysuria, hematuria, urinary  Frequency, nocturia, numbness, tingling, seizures,  Focal weakness, Loss of consciousness,  Tremor, insomnia, depression, anxiety, and suicidal ideation.      Objective:  BP 118/74 (BP Location: Left Arm, Patient Position: Sitting, Cuff Size: Large)    Pulse 78    Temp 98.2 F (36.8 C) (Oral)    Ht 5\' 3"  (1.6 m)    Wt 245 lb (111.1 kg)    SpO2 98%  BMI 43.40 kg/m   BP Readings from Last 3 Encounters:  10/08/21 118/74  07/10/21 123/60  07/03/20 94/63    Wt Readings from Last 3 Encounters:  10/08/21 245 lb (111.1 kg)  07/10/21 242 lb 9.6 oz (110 kg)  07/30/20 231 lb (104.8 kg)    General appearance: alert, cooperative and appears stated age Ears: normal TM's and external ear canals both ears Throat: lips, mucosa, and tongue normal; teeth and gums normal Neck: no adenopathy, no carotid bruit, supple, symmetrical, trachea midline and thyroid not enlarged, symmetric, no tenderness/mass/nodules Back: symmetric, no curvature. ROM normal. No CVA tenderness. Lungs:  clear to auscultation bilaterally Heart: regular rate and rhythm, S1, S2 normal, no murmur, click, rub or gallop Abdomen: soft, non-tender; bowel sounds normal; no masses,  no organomegaly Pulses: 2+ and symmetric Skin: Skin color, texture, turgor normal. No rashes or lesions Lymph nodes: Cervical, supraclavicular, and axillary nodes normal.  Lab Results  Component Value Date   HGBA1C 4.9 03/30/2020   HGBA1C 4.7 (L) 03/17/2019   HGBA1C 4.8 04/22/2017    Lab Results  Component Value Date   CREATININE 0.79 03/30/2020   CREATININE 0.74 04/22/2017   CREATININE 0.70 03/21/2015    Lab Results  Component Value Date   WBC 7.0 03/30/2020   HGB 13.4 03/30/2020   HCT 40.5 03/30/2020   PLT 223 03/30/2020   GLUCOSE 91 03/30/2020   CHOL 174 03/30/2020   TRIG 140 03/30/2020   HDL 66 03/30/2020   LDLCALC 84 03/30/2020   ALT 16 03/30/2020   AST 16 03/30/2020   NA 142 03/30/2020   K 4.1 03/30/2020   CL 104 03/30/2020   CREATININE 0.79 03/30/2020   BUN 11 03/30/2020   CO2 30 03/30/2020   TSH 1.47 03/30/2020   HGBA1C 4.9 03/30/2020    No results found.  Assessment & Plan:   Problem List Items Addressed This Visit     Fatigue    Screening labs ordered. Depression screen positive.  She defers therapy for now in favor of managing her obesity       Morbid obesity ([place])     Patient has been unable to lose or maintain a healthy weight despite previous attepts  And has stopped exercising. . Encouraged to increase exercise involvement to include more intense aerobic activity for 30 minutes 5 days per week.  Screened for contraindications to use of  GLP 1 agonists for appetite suppression and she has none.  The risks and benefits of pharmacotherapy discussed and she is requesting a trial of therapy .  rx written.        Relevant Medications   tirzepatide (MOUNJARO) 2.5 MG/0.5ML Pen   Other Visit Diagnoses     Impaired fasting glucose       Weight gain           I spent 30  minutes dedicated to the care of this patient on the date of this encounter to include pre-visit review of patient's medical history,  most recent imaging studies, Face-to-face time with the patient , and post visit ordering of testing and therapeutics.    Follow-up: No follow-ups on file.   Crecencio Mc, MD

## 2021-10-08 NOTE — Assessment & Plan Note (Addendum)
Patient has been unable to lose or maintain a healthy weight despite previous attepts  And has stopped exercising. . Encouraged to increase exercise involvement to include more intense aerobic activity for 30 minutes 5 days per week.  Screened for contraindications to use of  GLP 1 agonists for appetite suppression and she has none.  The risks and benefits of pharmacotherapy discussed and she is requesting a trial of therapy .  rx written.

## 2021-10-09 LAB — CBC WITH DIFFERENTIAL/PLATELET
Basophils Absolute: 0 10*3/uL (ref 0.0–0.1)
Basophils Relative: 0.5 % (ref 0.0–3.0)
Eosinophils Absolute: 0 10*3/uL (ref 0.0–0.7)
Eosinophils Relative: 0.4 % (ref 0.0–5.0)
HCT: 40.1 % (ref 36.0–46.0)
Hemoglobin: 13.5 g/dL (ref 12.0–15.0)
Lymphocytes Relative: 29.5 % (ref 12.0–46.0)
Lymphs Abs: 2.2 10*3/uL (ref 0.7–4.0)
MCHC: 33.5 g/dL (ref 30.0–36.0)
MCV: 83.4 fl (ref 78.0–100.0)
Monocytes Absolute: 0.5 10*3/uL (ref 0.1–1.0)
Monocytes Relative: 6.2 % (ref 3.0–12.0)
Neutro Abs: 4.7 10*3/uL (ref 1.4–7.7)
Neutrophils Relative %: 63.4 % (ref 43.0–77.0)
Platelets: 219 10*3/uL (ref 150.0–400.0)
RBC: 4.81 Mil/uL (ref 3.87–5.11)
RDW: 13.8 % (ref 11.5–15.5)
WBC: 7.4 10*3/uL (ref 4.0–10.5)

## 2021-10-09 LAB — COMPREHENSIVE METABOLIC PANEL
ALT: 20 U/L (ref 0–35)
AST: 19 U/L (ref 0–37)
Albumin: 4.5 g/dL (ref 3.5–5.2)
Alkaline Phosphatase: 56 U/L (ref 39–117)
BUN: 7 mg/dL (ref 6–23)
CO2: 29 mEq/L (ref 19–32)
Calcium: 9.4 mg/dL (ref 8.4–10.5)
Chloride: 102 mEq/L (ref 96–112)
Creatinine, Ser: 0.69 mg/dL (ref 0.40–1.20)
GFR: 117.32 mL/min (ref >60.00)
Glucose, Bld: 76 mg/dL (ref 70–99)
Potassium: 4 mEq/L (ref 3.5–5.1)
Sodium: 138 mEq/L (ref 135–145)
Total Bilirubin: 0.5 mg/dL (ref 0.2–1.2)
Total Protein: 7.2 g/dL (ref 6.0–8.3)

## 2021-10-09 LAB — LIPID PANEL
Cholesterol: 186 mg/dL (ref 0–200)
HDL: 66.8 mg/dL (ref >39.00)
LDL Cholesterol: 103 mg/dL — ABNORMAL HIGH (ref 0–99)
NonHDL: 118.76
Total CHOL/HDL Ratio: 3
Triglycerides: 79 mg/dL (ref 0.0–149.0)
VLDL: 15.8 mg/dL (ref 0.0–40.0)

## 2021-10-09 LAB — HEMOGLOBIN A1C: Hgb A1c MFr Bld: 5 % (ref 4.6–6.5)

## 2021-10-09 LAB — TSH: TSH: 2 u[IU]/mL (ref 0.35–5.50)

## 2021-10-11 ENCOUNTER — Other Ambulatory Visit: Payer: Self-pay | Admitting: Internal Medicine

## 2021-10-30 ENCOUNTER — Other Ambulatory Visit: Payer: Self-pay | Admitting: Internal Medicine

## 2021-10-30 ENCOUNTER — Encounter: Payer: Self-pay | Admitting: Internal Medicine

## 2021-10-30 MED ORDER — TIRZEPATIDE 5 MG/0.5ML ~~LOC~~ SOAJ: 5.0000 mg | SUBCUTANEOUS | 2 refills | Status: DC

## 2021-10-30 NOTE — Assessment & Plan Note (Signed)
She has had a weight loss of 11 ls with one month of mounjaro 2.5 mg  And no nausea weight has plateaued .  Will increase dose to 5 mg  ?

## 2021-11-27 ENCOUNTER — Other Ambulatory Visit: Payer: Self-pay

## 2021-11-27 ENCOUNTER — Encounter: Payer: Self-pay | Admitting: Internal Medicine

## 2021-11-27 MED ORDER — TIRZEPATIDE 5 MG/0.5ML ~~LOC~~ SOAJ: 5.0000 mg | SUBCUTANEOUS | 2 refills | Status: DC

## 2021-12-25 MED ORDER — TIRZEPATIDE 7.5 MG/0.5ML ~~LOC~~ SOAJ: 7.5000 mg | SUBCUTANEOUS | 2 refills | Status: DC

## 2022-03-18 ENCOUNTER — Encounter: Payer: Self-pay | Admitting: Internal Medicine

## 2022-03-18 MED ORDER — TIRZEPATIDE 10 MG/0.5ML ~~LOC~~ SOAJ: 10.0000 mg | SUBCUTANEOUS | 2 refills | Status: DC

## 2022-05-19 ENCOUNTER — Encounter: Payer: Self-pay | Admitting: Internal Medicine

## 2022-05-19 ENCOUNTER — Telehealth (INDEPENDENT_AMBULATORY_CARE_PROVIDER_SITE_OTHER): Payer: Managed Care, Other (non HMO) | Admitting: Internal Medicine

## 2022-05-19 VITALS — Ht 63.0 in | Wt 245.0 lb

## 2022-05-19 DIAGNOSIS — K529 Noninfective gastroenteritis and colitis, unspecified: Secondary | ICD-10-CM | POA: Diagnosis not present

## 2022-05-19 DIAGNOSIS — R197 Diarrhea, unspecified: Secondary | ICD-10-CM

## 2022-05-19 DIAGNOSIS — R112 Nausea with vomiting, unspecified: Secondary | ICD-10-CM

## 2022-05-19 MED ORDER — ONDANSETRON 4 MG PO TBDP: 4.0000 mg | ORAL_TABLET | Freq: Three times a day (TID) | ORAL | 0 refills | Status: DC | PRN

## 2022-05-19 NOTE — Progress Notes (Signed)
Virtual Visit via Video Note  I connected with Veronica Hurst   on 05/19/22 at  2:20 PM EDT by a video enabled telemedicine application and verified that I am speaking with the correct person using two identifiers.  Location patient: [place] Location provider:work or home office Persons participating in the virtual visit: patient, provider  I discussed the limitations and requested verbal permission for telemedicine visit. The patient expressed understanding and agreed to proceed.   HPI:  Acute telemedicine visit for :  1 am sick with ab cramping, liquid clear and cloudy stool Q1 hour since 1 am . She started n/v 6:30 am Q1.5 hours. Has tried gatorade and pedialyte. Her last dose of Mounjaro was last weds.  She has also tried vitamin C,d/zinc, no Abx, negative covid sxs, she has 2 pets at home dogs not sick, she has city water, no travel. She has also had cold chills Saturday and had to sit in the tube to get warm   Her son 2.5 years in daycare started with n/v/diarrhea after going to the movies Friday and had this through sat and had diarrhea Q1 hour then her husband and she had same sx's  -Pertinent past medical history: see below -Pertinent medication allergies:No Known Allergies -COVID-19 vaccine status:  Immunization History  Administered Date(s) Administered   Influenza,inj,Quad PF,6+ Mos 05/18/2019, 07/03/2020, 07/10/2021   Tdap 08/03/2019     ROS: See pertinent positives and negatives per HPI.  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 Please deliver, Disp: 40 tablet, Rfl: 0   sulfamethoxazole-trimethoprim (BACTRIM) 400-80 MG tablet, Take 1 tablet by mouth 2 (two) times daily., Disp: , Rfl:    tirzepatide (MOUNJARO) 10 MG/0.5ML Pen, Inject 10 mg into the skin once a week., Disp: 2 mL, Rfl: 2  EXAM:  VITALS per patient if applicable:  GENERAL: alert, oriented, appears well and in no acute distress, appears ill   HEENT: atraumatic, conjunttiva clear, no obvious abnormalities on inspection of external nose and ears  NECK: normal movements of the head and neck  LUNGS: on inspection no signs of respiratory distress, breathing rate appears normal, no obvious gross SOB, gasping or wheezing  CV: no obvious cyanosis  MS: moves all visible extremities without noticeable abnormality  PSYCH/NEURO: pleasant and cooperative, no obvious depression or anxiety, speech and thought processing grossly intact  ASSESSMENT AND PLAN:  Discussed the following assessment and plan:  Nausea and vomiting, unspecified vomiting type - Plan: ondansetron (ZOFRAN-ODT) 4 MG disintegrating tablet Gastroenteritis Diarrhea BRAT diet  If not better consider stool culture/C diff  Letter for work   -we discussed possible serious and likely etiologies, options for evaluation and workup, limitations of telemedicine visit vs  in person visit, treatment, treatment risks and precautions. Pt is agreeable to treatment via telemedicine at this moment.   I discussed the assessment and treatment plan with the patient. The patient was provided an opportunity to ask questions and all were answered. The patient agreed with the plan and demonstrated an understanding of the instructions.    Time  spent 20 minutes Delorise Jackson, MD

## 2022-05-19 NOTE — Patient Instructions (Signed)
Bland Diet A bland diet may consist of soft foods or foods that are not high in fat or are not greasy, acidic, or spicy. Avoiding certain foods may cause less irritation to your mouth, throat, stomach, or gastrointestinal tract. Avoiding certain foods may make you feel better. Everyone's tolerances are different. A bland diet should be based on what you can tolerate and what may cause discomfort. What is my plan? Your health care provider or dietitian may recommend specific changes to your diet to treat your symptoms. These changes may include: Eating small meals frequently. Cooking food until it is soft enough to chew easily. Taking the time to chew your food thoroughly, so it is easy to swallow and digest. Avoiding foods that cause you discomfort. These may include spicy food, fried food, greasy foods, hard-to-chew foods, or citrus fruits and juices. Drinking slowly. What are tips for following this plan? Reading food labels To reduce fiber intake, look for food labels that say "whole," such as whole wheat or whole grain. Shopping Avoid food items that may have nuts or seeds. Avoid vegetables that may make you gassy or have a tough texture, such as broccoli, cauliflower, or corn. Cooking Cook foods thoroughly so they have a soft texture. Meal planning Make sure you include foods from all food groups to eat a balanced diet. Eat a variety of types of foods. Eat foods and drink beverages that do not cause you discomfort. These may include soups and broths with cooked meats, pasta, and vegetables. Lifestyle Sit up after meals, avoid tight clothing, and take time to eat and chew your food slowly. Ask your health care provider whether you should take dietary supplements. General information Mildly season your foods. Some seasonings, such as cayenne pepper, vinegar, or hot sauce, may cause irritation. The foods, beverages, or seasonings to avoid should be based on individual tolerance. What  foods should I eat? Fruits Canned or cooked fruit such as peaches, pears, or applesauce. Bananas. Vegetables Well-cooked vegetables. Canned or cooked vegetables such as carrots, green beans, beets, or spinach. Mashed or boiled potatoes. Grains  Hot cereals, such as cream of wheat and processed oatmeal. Rice. Bread, crackers, pasta, or tortillas made from refined white flour. Meats and other proteins  Eggs. Creamy peanut butter or other nut butters. Lean, well-cooked tender meats, such as beef, pork, chicken, or fish. Dairy Low-fat dairy products such as milk, cottage cheese, or yogurt. Beverages  Water. Herbal tea. Apple juice. Fats and oils Mild salad dressings. Canola or olive oil. Sweets and desserts Low-fat pudding, custard, or ice cream. Fruit gelatin. The items listed above may not be a complete list of foods and beverages you can eat. Contact a dietitian for more information. What foods should I avoid? Fruits Citrus fruits, such as oranges and grapefruit. Fruits with a stringy texture. Fruits that have lots of seeds, such as kiwi or strawberries. Dried fruits. Vegetables Raw, uncooked vegetables. Salads. Grains Whole grain breads, muffins, and cereals. Meats and other proteins Tough, fibrous meats. Highly seasoned meat such as corned beef, smoked meats, or fish. Processed high-fat meats such as brats, hot dogs, or sausage. Dairy Full-fat dairy foods such as ice cream and cheese. Beverages Caffeinated drinks. Alcohol. Seasonings and condiments Strongly flavored seasonings or condiments. Hot sauce. Salsa. Other foods Spicy foods. Fried or greasy foods. Sour foods, such as pickled or fermented foods like sauerkraut. Foods high in fiber. The items listed above may not be a complete list of foods and beverages you should   avoid. Contact a dietitian for more information. Summary A bland diet should be based on individual tolerance. It may consist of foods that are soft  textured and do not have a lot of fat, fiber, acid, or seasonings. A bland diet may be recommended because avoiding certain foods, beverages, or spices may make you feel better. This information is not intended to replace advice given to you by your health care provider. Make sure you discuss any questions you have with your health care provider. Document Revised: 06/24/2021 Document Reviewed: 06/24/2021 Elsevier Patient Education  [place].  Diarrhea, Adult Diarrhea is frequent loose and watery bowel movements. Diarrhea can make you feel weak and cause you to become dehydrated. Dehydration can make you tired and thirsty, cause you to have a dry mouth, and decrease how often you urinate. Diarrhea typically lasts 2-3 days. However, it can last longer if it is a sign of something more serious. It is important to treat your diarrhea as told by your health care provider. Follow these instructions at home: Eating and drinking     Follow these recommendations as told by your health care provider: Take an oral rehydration solution (ORS). This is an over-the-counter medicine that helps return your body to its normal balance of nutrients and water. It is found at pharmacies and retail stores. Drink plenty of fluids, such as water, ice chips, diluted fruit juice, and low-calorie sports drinks. You can drink milk also, if desired. Avoid drinking fluids that contain a lot of sugar or caffeine, such as energy drinks, sports drinks, and soda. Eat bland, easy-to-digest foods in small amounts as you are able. These foods include bananas, applesauce, rice, lean meats, toast, and crackers. Avoid alcohol. Avoid spicy or fatty foods.  Medicines Take over-the-counter and prescription medicines only as told by your health care provider. If you were prescribed an antibiotic medicine, take it as told by your health care provider. Do not stop using the antibiotic even if you start to feel better. General  instructions  Wash your hands often using soap and water. If soap and water are not available, use a hand sanitizer. Others in the household should wash their hands as well. Hands should be washed: After using the toilet or changing a diaper. Before preparing, cooking, or serving food. While caring for a sick person or while visiting someone in a hospital. Drink enough fluid to keep your urine pale yellow. Rest at home while you recover. Watch your condition for any changes. Take a warm bath to relieve any burning or pain from frequent diarrhea episodes. Keep all follow-up visits as told by your health care provider. This is important. Contact a health care provider if: You have a fever. Your diarrhea gets worse. You have new symptoms. You cannot keep fluids down. You feel light-headed or dizzy. You have a headache. You have muscle cramps. Get help right away if: You have chest pain. You feel extremely weak or you faint. You have bloody or black stools or stools that look like tar. You have severe pain, cramping, or bloating in your abdomen. You have trouble breathing or you are breathing very quickly. Your heart is beating very quickly. Your skin feels cold and clammy. You feel confused. You have signs of dehydration, such as: Dark urine, very little urine, or no urine. Cracked lips. Dry mouth. Sunken eyes. Sleepiness. Weakness. Summary Diarrhea is frequent loose and sometimes watery bowel movements. Diarrhea can make you feel weak and cause you to become  dehydrated. Drink enough fluids to keep your urine pale yellow. Make sure that you wash your hands after using the toilet. If soap and water are not available, use hand sanitizer. Contact a health care provider if your diarrhea gets worse or you have new symptoms. Get help right away if you have signs of dehydration. This information is not intended to replace advice given to you by your health care provider. Make sure you  discuss any questions you have with your health care provider. Document Revised: 10/25/2021 Document Reviewed: 02/13/2021 Elsevier Patient Education  [place].

## 2022-06-03 ENCOUNTER — Encounter: Payer: Self-pay | Admitting: Certified Nurse Midwife

## 2022-07-16 ENCOUNTER — Encounter: Payer: Managed Care, Other (non HMO) | Admitting: Certified Nurse Midwife

## 2022-07-28 ENCOUNTER — Encounter: Payer: Self-pay | Admitting: Medical

## 2022-07-28 ENCOUNTER — Ambulatory Visit (INDEPENDENT_AMBULATORY_CARE_PROVIDER_SITE_OTHER): Payer: Managed Care, Other (non HMO) | Admitting: Medical

## 2022-07-28 VITALS — Ht 63.0 in | Wt 205.0 lb

## 2022-07-28 DIAGNOSIS — Z01419 Encounter for gynecological examination (general) (routine) without abnormal findings: Secondary | ICD-10-CM

## 2022-07-28 DIAGNOSIS — F411 Generalized anxiety disorder: Secondary | ICD-10-CM | POA: Diagnosis not present

## 2022-07-28 NOTE — Progress Notes (Signed)
History:  Veronica Hurst is a 31 y.o. G1P1001 who presents to clinic today for annual exam. Last pap smear was normal 07/03/2020. Patient is sexually active with one female partner, her husband. She uses condoms. She states low libido. She also expresses significant anxiety when she is alone with her son in public situations. She states that she is constantly worried that something bad is going to happen to him. She says she has started to note him repeating things that she has said to him in moments of high anxiety and doesn't want to feel this way. She agrees that this is likely impacting her libido as well.  Her periods are regular and last 3-5 days with moderate flow and minimal cramping. She denies bleeding, abnormal discharge, pain, UTI symptoms, GI or breast concerns today.   The following portions of the patient's history were reviewed and updated as appropriate: allergies, current medications, family history, past medical history, social history, past surgical history and problem list.  Review of Systems:  Review of Systems  Constitutional:  Negative for fever and malaise/fatigue.  Gastrointestinal:  Negative for abdominal pain, constipation, diarrhea, nausea and vomiting.  Genitourinary:  Negative for dysuria, frequency and urgency.       Neg - vaginal bleeding, discharge, pelvic pain  Psychiatric/Behavioral:  The patient is nervous/anxious.       Objective:  Physical Exam Ht 5\' 3"  (1.6 m)   Wt 205 lb (93 kg)   LMP 07/15/2022 (Exact Date)   BMI 36.31 kg/m  Physical Exam Vitals reviewed. Exam conducted with a chaperone present.  Constitutional:      General: She is not in acute distress.    Appearance: Normal appearance. She is well-developed. She is obese.  HENT:     Head: Normocephalic and atraumatic.  Neck:     Thyroid: No thyromegaly.  Cardiovascular:     Rate and Rhythm: Normal rate and regular rhythm.     Heart sounds: No murmur heard. Pulmonary:     Effort:  Pulmonary effort is normal. No respiratory distress.     Breath sounds: Normal breath sounds. No wheezing.  Abdominal:     General: Abdomen is flat. Bowel sounds are normal. There is no distension.     Palpations: Abdomen is soft. There is no mass.     Tenderness: There is no abdominal tenderness. There is no guarding or rebound.  Genitourinary:    General: Normal vulva.     Vagina: Vaginal discharge (small, white, non-odorous) present. No erythema, tenderness or bleeding.     Cervix: No cervical motion tenderness, discharge, friability, lesion, erythema or cervical bleeding.     Uterus: Not enlarged and not tender.      Adnexa:        Right: No mass or tenderness.         Left: No mass or tenderness.    Musculoskeletal:     Cervical back: Neck supple.  Skin:    General: Skin is warm and dry.     Findings: No erythema.  Neurological:     Mental Status: She is alert and oriented to person, place, and time.  Psychiatric:        Mood and Affect: Affect is tearful.    Labs, imaging and previous visits in Epic reviewed  Assessment & Plan:  1. Women's annual routine gynecological examination - Last pap smear normal 2021, advised next pap smear 2024 - Continue condom use   2. Anxiety - Recommend therapy  or counseling, check with insurance for best affordable options - If PRN anxiety medications are recommended, discuss with PCP - Anxiety related to care for child may be impacting libido, patient agrees this is likely related and we will discuss further at a future visit if not resolved.   Return to [facility] in 1 year for annual exam or sooner PRN   Kathlene Cote 07/28/2022 11:15 AM

## 2022-08-14 ENCOUNTER — Encounter: Payer: Self-pay | Admitting: Internal Medicine

## 2022-08-15 MED ORDER — TIRZEPATIDE 12.5 MG/0.5ML ~~LOC~~ SOAJ: 12.5000 mg | SUBCUTANEOUS | 2 refills | Status: DC

## 2022-10-06 ENCOUNTER — Telehealth: Payer: Self-pay | Admitting: Internal Medicine

## 2022-10-06 DIAGNOSIS — R7301 Impaired fasting glucose: Secondary | ICD-10-CM

## 2022-10-06 DIAGNOSIS — E785 Hyperlipidemia, unspecified: Secondary | ICD-10-CM

## 2022-10-06 DIAGNOSIS — R5383 Other fatigue: Secondary | ICD-10-CM

## 2022-10-06 NOTE — Telephone Encounter (Signed)
Pt called wanting to get her labs done because she has lost a lot of weight on the medication she has been on

## 2022-10-06 NOTE — Telephone Encounter (Signed)
I have pended labs for your approval.

## 2022-10-09 ENCOUNTER — Telehealth: Payer: Self-pay

## 2022-10-09 ENCOUNTER — Other Ambulatory Visit (HOSPITAL_COMMUNITY): Payer: Self-pay

## 2022-10-09 NOTE — Telephone Encounter (Signed)
Pharmacy Patient Advocate Encounter   Received notification that prior authorization for [facility] 12.26m/0.5ml is required/requested.  Per Test Claim: prior authorization is required   PA submitted on 10/09/22 to (ins) [employer] via fax at [phone] Status is pending

## 2022-10-10 NOTE — Telephone Encounter (Signed)
noted 

## 2022-10-14 MED ORDER — ZEPBOUND 12.5 MG/0.5ML ~~LOC~~ SOAJ: 12.5000 mg | SUBCUTANEOUS | 2 refills | Status: DC

## 2022-10-14 NOTE — Addendum Note (Signed)
Addended by: Crecencio Mc on: 10/14/2022 09:19 PM   Modules accepted: Orders

## 2022-10-14 NOTE — Telephone Encounter (Signed)
Received a fax regarding Prior Authorization from [place] for [place] 12.'5mg'$ /0.85m.   Authorization has been DENIED because coverage is provided for type 2 diabetes mellitus. Coverage cannot be authorized at this time.  Phone# [phone] Denial letter attached to chart

## 2022-10-15 NOTE — Telephone Encounter (Signed)
PA Zepbound

## 2022-10-16 ENCOUNTER — Other Ambulatory Visit (HOSPITAL_COMMUNITY): Payer: Self-pay

## 2022-10-16 NOTE — Telephone Encounter (Addendum)
Ran test claim, came back plan exclusion  Initiated PA via CMM. Key: [id number]. Came back drug is not covered by plan

## 2022-10-16 NOTE — Telephone Encounter (Signed)
Mychart message sent.

## 2023-05-08 ENCOUNTER — Encounter: Payer: Self-pay | Admitting: Nurse Practitioner

## 2023-05-08 ENCOUNTER — Ambulatory Visit: Payer: Managed Care, Other (non HMO) | Admitting: Nurse Practitioner

## 2023-05-08 VITALS — BP 110/62 | HR 72 | Temp 98.2°F | Ht 63.0 in | Wt 218.8 lb

## 2023-05-08 DIAGNOSIS — S29012A Strain of muscle and tendon of back wall of thorax, initial encounter: Secondary | ICD-10-CM | POA: Insufficient documentation

## 2023-05-08 MED ORDER — METHOCARBAMOL 500 MG PO TABS: 500.0000 mg | ORAL_TABLET | Freq: Three times a day (TID) | ORAL | 0 refills | Status: AC | PRN

## 2023-05-08 MED ORDER — METHYLPREDNISOLONE 4 MG PO TBPK: ORAL_TABLET | ORAL | 0 refills | Status: DC

## 2023-05-08 NOTE — Assessment & Plan Note (Signed)
Consistent with strain of left lat likely due to increased activity and stretching. Will treat with MDP and Robaxin. She can continue gentle massage, Ibuprofen and application of heat as needed. Advised OTC pain relief creams/patches as needed. Return precautions given to patient.

## 2023-05-08 NOTE — Progress Notes (Signed)
Bethanie Dicker, NP-C Phone: [phone]  Veronica Hurst is a 32 y.o. female who presents today for back pain.   She has been trying to lose weight and is apart of a weight loss challenge. She has been increasing her activity and stretching more, in doing so she thinks she has pulled a muscle in her back.  Back Pain She reports new onset back pain. There was not an injury that may have caused the pain. The most recent episode started  3 days ago and is staying constant. The pain is located in the left latissimus dorsi muscle without radiation. It is described as soreness, is moderate in intensity, occurring constantly.   Aggravating factors: certain movements, sitting, prolonged standing  Relieving factors: recumbency.  She has tried application of heat, NSAIDs, and message therapy with mild relief.   Associated symptoms: No abdominal pain No bowel incontinence  No chest pain No dysuria   No fever No headaches  No joint pains No pelvic pain  No weakness in leg  No tingling in lower extremities  No urinary incontinence No weight loss   -----------------------------------------------------------------------------------------  Social History   Tobacco Use  Smoking Status Never  Smokeless Tobacco Never    Current Outpatient Medications on File Prior to Visit  Medication Sig Dispense Refill   doxycycline (VIBRA-TABS) 100 MG tablet Take 1 tablet (100 mg total) by mouth daily. 30 tablet 2   sulfamethoxazole-trimethoprim (BACTRIM) 400-80 MG tablet Take 1 tablet by mouth 2 (two) times daily.     tirzepatide (ZEPBOUND) 12.5 MG/0.5ML Pen Inject 12.5 mg into the skin once a week. 2 mL 2   No current facility-administered medications on file prior to visit.    ROS see history of present illness  Objective  Physical Exam Vitals:   05/08/23 1536  BP: 110/62  Pulse: 72  Temp: 98.2 F (36.8 C)  SpO2: 100%    BP Readings from Last 3 Encounters:  05/08/23 110/62  10/08/21 118/74   07/10/21 123/60   Wt Readings from Last 3 Encounters:  05/08/23 218 lb 12.8 oz (99.2 kg)  07/28/22 205 lb (93 kg)  05/19/22 245 lb (111.1 kg)    Physical Exam Constitutional:      General: She is not in acute distress.    Appearance: Normal appearance.  HENT:     Head: Normocephalic.  Cardiovascular:     Rate and Rhythm: Normal rate and regular rhythm.     Heart sounds: Normal heart sounds.  Pulmonary:     Effort: Pulmonary effort is normal.     Breath sounds: Normal breath sounds.  Musculoskeletal:     Thoracic back: Tenderness (left lat) present.       Back:  Skin:    General: Skin is warm and dry.  Neurological:     General: No focal deficit present.     Mental Status: She is alert.  Psychiatric:        Mood and Affect: Mood normal.        Behavior: Behavior normal.    Assessment/Plan: Please see individual problem list.  Strain of left latissimus dorsi muscle Assessment & Plan: Consistent with strain of left lat likely due to increased activity and stretching. Will treat with MDP and Robaxin. She can continue gentle massage, Ibuprofen and application of heat as needed. Advised OTC pain relief creams/patches as needed. Return precautions given to patient.   Orders: -     methylPREDNISolone; Take as directed.  Dispense: 21 each; Refill: 0 -  Methocarbamol; Take 1 tablet (500 mg total) by mouth every 8 (eight) hours as needed for muscle spasms.  Dispense: 30 tablet; Refill: 0   Return if symptoms worsen or fail to improve.   Bethanie Dicker, NP-C [facility] [employer]

## 2023-07-09 ENCOUNTER — Telehealth: Payer: Self-pay

## 2023-07-09 NOTE — Telephone Encounter (Signed)
Left message for patient to call office back to schedule annual appt after 07/29/23

## 2023-08-03 ENCOUNTER — Other Ambulatory Visit (HOSPITAL_COMMUNITY)
Admission: RE | Admit: 2023-08-03 | Discharge: 2023-08-03 | Disposition: A | Discharge status: Home / Self Care | Payer: Managed Care, Other (non HMO) | Source: Ambulatory Visit | Attending: Certified Nurse Midwife | Admitting: Certified Nurse Midwife

## 2023-08-03 ENCOUNTER — Ambulatory Visit: Payer: Managed Care, Other (non HMO) | Admitting: Certified Nurse Midwife

## 2023-08-03 ENCOUNTER — Encounter: Payer: Self-pay | Admitting: Certified Nurse Midwife

## 2023-08-03 VITALS — BP 105/71 | HR 81 | Wt 221.2 lb

## 2023-08-03 DIAGNOSIS — Z124 Encounter for screening for malignant neoplasm of cervix: Secondary | ICD-10-CM | POA: Diagnosis present

## 2023-08-03 DIAGNOSIS — Z01419 Encounter for gynecological examination (general) (routine) without abnormal findings: Secondary | ICD-10-CM | POA: Diagnosis present

## 2023-08-03 DIAGNOSIS — R6882 Decreased libido: Secondary | ICD-10-CM

## 2023-08-03 MED ORDER — HYDROXYZINE PAMOATE 25 MG PO CAPS: 25.0000 mg | ORAL_CAPSULE | Freq: Three times a day (TID) | ORAL | 0 refills | Status: AC | PRN

## 2023-08-03 NOTE — Patient Instructions (Signed)

## 2023-08-03 NOTE — Progress Notes (Addendum)
GYNECOLOGY ANNUAL PREVENTATIVE CARE ENCOUNTER NOTE  History:     Veronica Hurst is a 32 y.o. G52P1001 female here for a routine annual gynecologic exam.  Current complaints: low libido, irritability .  Pt stats she feels irritable with her 31 yr old and does not want to lose it with him . She states she feels like she is yelling at him and telling him no all the time. She feels like this is worse during her period. She would like to take something to help calm her.  Denies abnormal vaginal bleeding, discharge, pelvic pain, problems with intercourse or other gynecologic concerns.     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  Exercise:rare  Smoke/Alcohol/drug EXB:MWUXLKGMWN alcohol use   Gynecologic History Patient's last menstrual period was 07/08/2023 (exact date). Contraception:  natural family planning , condoms Last Pap: 07/03/2020. Results were: normal Last mammogram: n/a .    Upstream - 08/03/23 1421       Pregnancy Intention Screening   Does the patient want to become pregnant in the next year? No    Does the patient's partner want to become pregnant in the next year? No    Would the patient like to discuss contraceptive options today? No      Contraception Wrap Up   Current Method Female Condom    End Method Female Condom    Contraception Counseling Provided No            The pregnancy intention screening data noted above was reviewed. Potential methods of contraception were discussed. The patient elected to proceed with Female Condom.   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     doxycycline (VIBRA-TABS) 100 MG tablet Take 1 tablet (100 mg total) by mouth daily. 30 tablet 2   magnesium 30 MG tablet Take 30 mg by mouth 2 (two) times daily.     methocarbamol (ROBAXIN) 500 MG tablet Take 1 tablet (500 mg total) by mouth every 8 (eight) hours as needed for muscle spasms. 30 tablet 0   methylPREDNISolone (MEDROL DOSEPAK) 4 MG TBPK tablet Take as directed. 21 each 0   sulfamethoxazole-trimethoprim (BACTRIM) 400-80 MG tablet Take 1 tablet by mouth 2 (two) times daily.     tirzepatide (ZEPBOUND) 12.5 MG/0.5ML Pen Inject 12.5 mg into the skin once a week. 2 mL 2   No current facility-administered medications on file prior to visit.    No Known Allergies  Social  History:  reports that she has never smoked. She has never used smokeless tobacco. She reports current alcohol use. She reports that she does not use drugs.  Family History  Problem Relation Age of Onset   Hyperlipidemia Mother    Hypertension Father    Heart disease Father        on dad's side   Thyroid disease Father    Breast cancer Maternal Aunt        15s   Thyroid disease Maternal Aunt    Breast cancer Maternal Grandfather        late years    The following portions of the patient's history were reviewed and updated as appropriate: allergies, current medications, past family history, past medical history, past social history, past surgical history and problem list.  Review of Systems Pertinent items noted in HPI and remainder of  comprehensive ROS otherwise negative.  Physical Exam:  BP 105/71   Pulse 81   Wt 221 lb 3.2 oz (100.3 kg)   LMP 07/08/2023 (Exact Date)   BMI 39.18 kg/m  CONSTITUTIONAL: Well-developed, well-nourished female in no acute distress.  HENT:  Normocephalic, atraumatic, External right and left ear normal. Oropharynx is clear and moist EYES: Conjunctivae and EOM are normal. Pupils are equal, round, and reactive to light. No scleral icterus.  NECK: Normal range of motion, supple, no masses.  Normal thyroid.  SKIN: Skin is warm and dry. No rash noted. Not diaphoretic. No erythema. No pallor. MUSCULOSKELETAL: Normal range of motion. No tenderness.  No cyanosis, clubbing, or edema.  2+ distal pulses. NEUROLOGIC: Alert and oriented to person, place, and time. Normal reflexes, muscle tone coordination.  PSYCHIATRIC: Normal mood and affect. Normal behavior. Normal judgment and thought content. CARDIOVASCULAR: Normal heart rate noted, regular rhythm RESPIRATORY: Clear to auscultation bilaterally. Effort and breath sounds normal, no problems with respiration noted. BREASTS: Symmetric in size. No masses, tenderness, skin changes, nipple drainage, or lymphadenopathy bilaterally.  ABDOMEN: Soft, no distention noted.  No tenderness, rebound or guarding.  PELVIC: Normal appearing external genitalia and urethral meatus; normal appearing vaginal mucosa and cervix.  No abnormal discharge noted.  Pap smear obtained.  Normal uterine size, no other palpable masses, no uterine or adnexal tenderness.  .      08/03/2023    3:14 PM 05/08/2023    3:38 PM 07/10/2021    2:31 PM  GAD 7 : Generalized Anxiety Score  Nervous, Anxious, on Edge 1 1 2   Control/stop worrying 1 2 3   Worry too much - different things 1 2 3   Trouble relaxing 1 1 2   Restless 0 0 0  Easily annoyed or irritable 1 2 2   Afraid - awful might happen 1 1 2   Total GAD 7 Score 6 9 14   Anxiety Difficulty Not difficult at all Not difficult at all  Somewhat difficult     Flowsheet Row Office Visit from 08/03/2023 in [facility] Total Score 7       Assessment and Plan:    1. Women's annual routine gynecological examination (Primary)  Pap: Will follow up results of pap smear and manage accordingly. Mammogram : n/a  Labs: tsh, t4, fsh/lh ration, cortisol, estradiol , testosterone  Refills: vistaril for anxiety Referral: none   We discussed use of vistaril . If taking it often pt encourage to take SSRI to help manage. She is in agreement.  Routine preventative health maintenance measures emphasized. Please refer to After Visit Summary for  other counseling recommendations.      Doreene Burke, CNM [facility]  [facility],  [facility]

## 2023-08-05 LAB — FSH/LH
FSH: 2.3 m[IU]/mL
LH: 1.6 m[IU]/mL

## 2023-08-05 LAB — TSH+FREE T4
Free T4: 1.09 ng/dL (ref 0.82–1.77)
TSH: 2.51 u[IU]/mL (ref 0.450–4.500)

## 2023-08-05 LAB — TESTOSTERONE, FREE, TOTAL, SHBG
Sex Hormone Binding: 54.1 nmol/L (ref 24.6–122.0)
Testosterone, Free: 0.4 pg/mL (ref 0.0–4.2)
Testosterone: 4 ng/dL — ABNORMAL LOW (ref 8–60)

## 2023-08-05 LAB — ESTRADIOL: Estradiol: 98 pg/mL

## 2023-08-05 LAB — CORTISOL: Cortisol: 6.6 ug/dL (ref 6.2–19.4)

## 2023-08-06 LAB — CYTOLOGY - PAP
Comment: NEGATIVE
Diagnosis: NEGATIVE
High risk HPV: NEGATIVE

## 2023-08-08 ENCOUNTER — Encounter: Payer: Self-pay | Admitting: Certified Nurse Midwife

## 2023-08-08 NOTE — Progress Notes (Signed)
 GYNECOLOGY ANNUAL PREVENTATIVE CARE ENCOUNTER NOTE  History:     Veronica Hurst is a 32 y.o. G52P1001 female here for a routine annual gynecologic exam.  Current complaints: low libido, irritability .  Pt stats she feels irritable with her 31 yr old and does not want to lose it with him . She states she feels like she is yelling at him and telling him no all the time. She feels like this is worse during her period. She would like to take something to help calm her.  Denies abnormal vaginal bleeding, discharge, pelvic pain, problems with intercourse or other gynecologic concerns.     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  Exercise:rare  Smoke/Alcohol/drug EXB:MWUXLKGMWN alcohol use   Gynecologic History Patient's last menstrual period was 07/08/2023 (exact date). Contraception:  natural family planning , condoms Last Pap: 07/03/2020. Results were: normal Last mammogram: n/a .    Upstream - 08/03/23 1421       Pregnancy Intention Screening   Does the patient want to become pregnant in the next year? No    Does the patient's partner want to become pregnant in the next year? No    Would the patient like to discuss contraceptive options today? No      Contraception Wrap Up   Current Method Female Condom    End Method Female Condom    Contraception Counseling Provided No            The pregnancy intention screening data noted above was reviewed. Potential methods of contraception were discussed. The patient elected to proceed with Female Condom.   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     doxycycline (VIBRA-TABS) 100 MG tablet Take 1 tablet (100 mg total) by mouth daily. 30 tablet 2   magnesium 30 MG tablet Take 30 mg by mouth 2 (two) times daily.     methocarbamol (ROBAXIN) 500 MG tablet Take 1 tablet (500 mg total) by mouth every 8 (eight) hours as needed for muscle spasms. 30 tablet 0   methylPREDNISolone (MEDROL DOSEPAK) 4 MG TBPK tablet Take as directed. 21 each 0   sulfamethoxazole-trimethoprim (BACTRIM) 400-80 MG tablet Take 1 tablet by mouth 2 (two) times daily.     tirzepatide (ZEPBOUND) 12.5 MG/0.5ML Pen Inject 12.5 mg into the skin once a week. 2 mL 2   No current facility-administered medications on file prior to visit.    No Known Allergies  Social  History:  reports that she has never smoked. She has never used smokeless tobacco. She reports current alcohol use. She reports that she does not use drugs.  Family History  Problem Relation Age of Onset   Hyperlipidemia Mother    Hypertension Father    Heart disease Father        on dad's side   Thyroid disease Father    Breast cancer Maternal Aunt        15s   Thyroid disease Maternal Aunt    Breast cancer Maternal Grandfather        late years    The following portions of the patient's history were reviewed and updated as appropriate: allergies, current medications, past family history, past medical history, past social history, past surgical history and problem list.  Review of Systems Pertinent items noted in HPI and remainder of  comprehensive ROS otherwise negative.  Physical Exam:  BP 105/71   Pulse 81   Wt 221 lb 3.2 oz (100.3 kg)   LMP 07/08/2023 (Exact Date)   BMI 39.18 kg/m  CONSTITUTIONAL: Well-developed, well-nourished female in no acute distress.  HENT:  Normocephalic, atraumatic, External right and left ear normal. Oropharynx is clear and moist EYES: Conjunctivae and EOM are normal. Pupils are equal, round, and reactive to light. No scleral icterus.  NECK: Normal range of motion, supple, no masses.  Normal thyroid.  SKIN: Skin is warm and dry. No rash noted. Not diaphoretic. No erythema. No pallor. MUSCULOSKELETAL: Normal range of motion. No tenderness.  No cyanosis, clubbing, or edema.  2+ distal pulses. NEUROLOGIC: Alert and oriented to person, place, and time. Normal reflexes, muscle tone coordination.  PSYCHIATRIC: Normal mood and affect. Normal behavior. Normal judgment and thought content. CARDIOVASCULAR: Normal heart rate noted, regular rhythm RESPIRATORY: Clear to auscultation bilaterally. Effort and breath sounds normal, no problems with respiration noted. BREASTS: Symmetric in size. No masses, tenderness, skin changes, nipple drainage, or lymphadenopathy bilaterally.  ABDOMEN: Soft, no distention noted.  No tenderness, rebound or guarding.  PELVIC: Normal appearing external genitalia and urethral meatus; normal appearing vaginal mucosa and cervix.  No abnormal discharge noted.  Pap smear obtained.  Normal uterine size, no other palpable masses, no uterine or adnexal tenderness.  .      08/03/2023    3:14 PM 05/08/2023    3:38 PM 07/10/2021    2:31 PM  GAD 7 : Generalized Anxiety Score  Nervous, Anxious, on Edge 1 1 2   Control/stop worrying 1 2 3   Worry too much - different things 1 2 3   Trouble relaxing 1 1 2   Restless 0 0 0  Easily annoyed or irritable 1 2 2   Afraid - awful might happen 1 1 2   Total GAD 7 Score 6 9 14   Anxiety Difficulty Not difficult at all Not difficult at all  Somewhat difficult     Flowsheet Row Office Visit from 08/03/2023 in [facility] at [facility] Total Score 7       Assessment and Plan:    1. Women's annual routine gynecological examination (Primary)  Pap: Will follow up results of pap smear and manage accordingly. Mammogram : n/a  Labs: tsh, t4, fsh/lh ration, cortisol, estradiol , testosterone  Refills: vistaril for anxiety Referral: none   We discussed use of vistaril . If taking it often pt encourage to take SSRI to help manage. She is in agreement.  Routine preventative health maintenance measures emphasized. Please refer to After Visit Summary for  other counseling recommendations.      Doreene Burke, CNM [facility]  [facility],  [facility]

## 2024-01-26 ENCOUNTER — Telehealth: Payer: Self-pay

## 2024-01-28 ENCOUNTER — Encounter: Payer: Self-pay | Admitting: Internal Medicine

## 2024-01-28 ENCOUNTER — Ambulatory Visit: Admitting: Internal Medicine

## 2024-01-28 VITALS — BP 106/68 | HR 84 | Ht 63.0 in | Wt 222.6 lb

## 2024-01-28 DIAGNOSIS — R5383 Other fatigue: Secondary | ICD-10-CM

## 2024-01-28 DIAGNOSIS — R7301 Impaired fasting glucose: Secondary | ICD-10-CM | POA: Diagnosis not present

## 2024-01-28 DIAGNOSIS — F900 Attention-deficit hyperactivity disorder, predominantly inattentive type: Secondary | ICD-10-CM

## 2024-01-28 DIAGNOSIS — J0110 Acute frontal sinusitis, unspecified: Secondary | ICD-10-CM

## 2024-01-28 DIAGNOSIS — E785 Hyperlipidemia, unspecified: Secondary | ICD-10-CM

## 2024-01-28 DIAGNOSIS — E559 Vitamin D deficiency, unspecified: Secondary | ICD-10-CM

## 2024-01-28 DIAGNOSIS — E538 Deficiency of other specified B group vitamins: Secondary | ICD-10-CM

## 2024-01-28 LAB — COMPREHENSIVE METABOLIC PANEL WITH GFR
ALT: 23 U/L (ref 0–35)
AST: 20 U/L (ref 0–37)
Albumin: 4.6 g/dL (ref 3.5–5.2)
Alkaline Phosphatase: 70 U/L (ref 39–117)
BUN: 10 mg/dL (ref 6–23)
CO2: 29 meq/L (ref 19–32)
Calcium: 9.5 mg/dL (ref 8.4–10.5)
Chloride: 101 meq/L (ref 96–112)
Creatinine, Ser: 0.67 mg/dL (ref 0.40–1.20)
GFR: 116.26 mL/min (ref >60.00)
Glucose, Bld: 77 mg/dL (ref 70–99)
Potassium: 4 meq/L (ref 3.5–5.1)
Sodium: 139 meq/L (ref 135–145)
Total Bilirubin: 0.5 mg/dL (ref 0.2–1.2)
Total Protein: 7.6 g/dL (ref 6.0–8.3)

## 2024-01-28 LAB — LIPID PANEL
Cholesterol: 216 mg/dL — ABNORMAL HIGH (ref 0–200)
HDL: 80.1 mg/dL (ref >39.00)
LDL Cholesterol: 122 mg/dL — ABNORMAL HIGH (ref 0–99)
NonHDL: 135.53
Total CHOL/HDL Ratio: 3
Triglycerides: 66 mg/dL (ref 0.0–149.0)
VLDL: 13.2 mg/dL (ref 0.0–40.0)

## 2024-01-28 LAB — CBC WITH DIFFERENTIAL/PLATELET
Basophils Absolute: 0 10*3/uL (ref 0.0–0.1)
Basophils Relative: 0.5 % (ref 0.0–3.0)
Eosinophils Absolute: 0 10*3/uL (ref 0.0–0.7)
Eosinophils Relative: 0.1 % (ref 0.0–5.0)
HCT: 43 % (ref 36.0–46.0)
Hemoglobin: 14.4 g/dL (ref 12.0–15.0)
Lymphocytes Relative: 23.7 % (ref 12.0–46.0)
Lymphs Abs: 1.6 10*3/uL (ref 0.7–4.0)
MCHC: 33.4 g/dL (ref 30.0–36.0)
MCV: 85.3 fl (ref 78.0–100.0)
Monocytes Absolute: 0.6 10*3/uL (ref 0.1–1.0)
Monocytes Relative: 9.3 % (ref 3.0–12.0)
Neutro Abs: 4.5 10*3/uL (ref 1.4–7.7)
Neutrophils Relative %: 66.4 % (ref 43.0–77.0)
Platelets: 232 10*3/uL (ref 150.0–400.0)
RBC: 5.04 Mil/uL (ref 3.87–5.11)
RDW: 13 % (ref 11.5–15.5)
WBC: 6.7 10*3/uL (ref 4.0–10.5)

## 2024-01-28 LAB — HEMOGLOBIN A1C: Hgb A1c MFr Bld: 4.9 % (ref 4.6–6.5)

## 2024-01-28 LAB — LDL CHOLESTEROL, DIRECT: Direct LDL: 111 mg/dL

## 2024-01-28 MED ORDER — AMOXICILLIN-POT CLAVULANATE 875-125 MG PO TABS: 1.0000 | ORAL_TABLET | Freq: Two times a day (BID) | ORAL | 0 refills | Status: AC

## 2024-01-28 NOTE — Patient Instructions (Addendum)
  You might want to try using Relaxium for insomnia  (as seen on TV commercials) . It is available through [employer] and contains all natural supplements:  Melatonin 5 mg  Chamomile 25 mg Passionflower extract 75 mg GABA 100 mg Ashwaganda extract 125 mg Magnesium  citrate, glycinate, oxide (100 mg)  L tryptophan 500 mg Valerest (proprietary  ingredient ; probably valeria root extract)     We will begin a trial of wellbutrin  if your labs do not point  to an obvious  cause for your symptoms   2) I am treating you for sinusitis   I am prescribing an antibiotic (augmentin ) for 7 days.  Take tiwce daily with FOOD  Daily use of Probiotics for  3 weeks advised to reduce risk of C dificile colitis.  YOGURT IS FINE   I also advise use of the following OTC meds to help with your other symptoms.   you may use  Afrin nasal spray  twice daily for 5 days ONLY   Gargle with salt water as needed for sore throat.   Use the Deslym,  add mucineX if needed for the rubber cement snot that happens with sinus infections

## 2024-01-28 NOTE — Progress Notes (Signed)
 Subjective:  Patient ID: Veronica Hurst, female    DOB: Jun 27, 1991  Age: 33 y.o. MRN: [id number]  CC: The primary encounter diagnosis was Hyperlipidemia LDL goal <100. Diagnoses of Other fatigue, Impaired fasting glucose, Acute non-recurrent frontal sinusitis, Vitamin D  deficiency, B12 deficiency, and Attention deficit hyperactivity disorder (ADHD), inattentive type, moderate were also pertinent to this visit.   HPI Veronica Hurst presents for  Chief Complaint  Patient presents with   Medical Management of Chronic Issues   1) cough.  Veronica Hurst reports a cough attributed Sinus drainage  that has been present for  days and improves transiently  with steam but has not resolved. The sinus drainage was initially clear but has been purulent appearing for the past 4 days   2) obesty:  Veronica Hurst has had a weight gain of 17 lbs since GLP 1 agonist was stopped due to insurance nonpayment   3) lack of energy,  lack of interest  in anything.  Tired all the time.   works full time as a Associate Professor. =.  Has a 39 yr old child and a husband.   4) CONCERNED ABOUT HER LOW TESTOSTERONE  ,  Lack of libido.  Going to see a hormone specialist    Outpatient Medications Prior to Visit  Medication Sig Dispense Refill   ASHWAGANDHA PO Take by mouth.     hydrOXYzine  (VISTARIL ) 25 MG capsule Take 1 capsule (25 mg total) by mouth 3 (three) times daily as needed for anxiety. 30 capsule 0   magnesium  30 MG tablet Take 30 mg by mouth 2 (two) times daily.     methocarbamol  (ROBAXIN ) 500 MG tablet Take 1 tablet (500 mg total) by mouth every 8 (eight) hours as needed for muscle spasms. 30 tablet 0   doxycycline  (VIBRA -TABS) 100 MG tablet Take 1 tablet (100 mg total) by mouth daily. (Patient not taking: Reported on 01/28/2024) 30 tablet 2   sulfamethoxazole -trimethoprim  (BACTRIM ) 400-80 MG tablet Take 1 tablet by mouth 2 (two) times daily. (Patient not taking: Reported on 01/28/2024)     methylPREDNISolone  (MEDROL  DOSEPAK) 4 MG  TBPK tablet Take as directed. (Patient not taking: Reported on 01/28/2024) 21 each 0   tirzepatide  (ZEPBOUND ) 12.5 MG/0.5ML Pen Inject 12.5 mg into the skin once a week. (Patient not taking: Reported on 01/28/2024) 2 mL 2   No facility-administered medications prior to visit.    Review of Systems;  Patient denies headache, fevers, malaise, unintentional weight loss, skin rash, eye pain, sinus congestion and sinus pain, sore throat, dysphagia,  hemoptysis , cough, dyspnea, wheezing, chest pain, palpitations, orthopnea, edema, abdominal pain, nausea, melena, diarrhea, constipation, flank pain, dysuria, hematuria, urinary  Frequency, nocturia, numbness, tingling, seizures,  Focal weakness, Loss of consciousness,  Tremor, insomnia, anxiety, and suicidal ideation.      Objective:  BP 106/68   Pulse 84   Ht 5' 3 (1.6 m)   Wt 222 lb 9.6 oz (101 kg)   SpO2 99%   BMI 39.43 kg/m   BP Readings from Last 3 Encounters:  01/28/24 106/68  08/03/23 105/71  05/08/23 110/62    Wt Readings from Last 3 Encounters:  01/28/24 222 lb 9.6 oz (101 kg)  08/03/23 221 lb 3.2 oz (100.3 kg)  05/08/23 218 lb 12.8 oz (99.2 kg)    Physical Exam Vitals reviewed.  Constitutional:      General: She is not in acute distress.    Appearance: Normal appearance. She is normal weight. She is not ill-appearing, toxic-appearing  or diaphoretic.  HENT:     Head: Normocephalic.   Eyes:     General: No scleral icterus.       Right eye: No discharge.        Left eye: No discharge.     Conjunctiva/sclera: Conjunctivae normal.    Cardiovascular:     Rate and Rhythm: Normal rate and regular rhythm.     Heart sounds: Normal heart sounds.  Pulmonary:     Effort: Pulmonary effort is normal. No respiratory distress.     Breath sounds: Normal breath sounds.   Musculoskeletal:        General: Normal range of motion.   Skin:    General: Skin is warm and dry.   Neurological:     General: No focal deficit present.      Mental Status: She is alert and oriented to person, place, and time. Mental status is at baseline.   Psychiatric:        Mood and Affect: Mood normal.        Behavior: Behavior normal.        Thought Content: Thought content normal.        Judgment: Judgment normal.    Lab Results  Component Value Date   HGBA1C 4.9 01/28/2024   HGBA1C 5.0 10/08/2021   HGBA1C 4.9 03/30/2020    Lab Results  Component Value Date   CREATININE 0.67 01/28/2024   CREATININE 0.69 10/08/2021   CREATININE 0.79 03/30/2020    Lab Results  Component Value Date   WBC 6.7 01/28/2024   HGB 14.4 01/28/2024   HCT 43.0 01/28/2024   PLT 232.0 01/28/2024   GLUCOSE 77 01/28/2024   CHOL 216 (H) 01/28/2024   TRIG 66.0 01/28/2024   HDL 80.10 01/28/2024   LDLDIRECT 111.0 01/28/2024   LDLCALC 122 (H) 01/28/2024   ALT 23 01/28/2024   AST 20 01/28/2024   NA 139 01/28/2024   K 4.0 01/28/2024   CL 101 01/28/2024   CREATININE 0.67 01/28/2024   BUN 10 01/28/2024   CO2 29 01/28/2024   TSH 2.360 01/28/2024   HGBA1C 4.9 01/28/2024    No results found.  Assessment & Plan:  .Hyperlipidemia LDL goal <100 -     Lipid panel -     LDL cholesterol, direct  Other fatigue Assessment & Plan: Positive depression screen.  Screening labs normal.  Wellbutrin xl trial prescribed   Orders: -     TSH -     CBC with Differential/Platelet -     Iron, TIBC and Ferritin Panel  Impaired fasting glucose -     Comprehensive metabolic panel with GFR -     Hemoglobin A1c  Acute non-recurrent frontal sinusitis  Vitamin D  deficiency -     VITAMIN D  25 Hydroxy (Vit-D Deficiency, Fractures)  B12 deficiency -     Vitamin B12  Attention deficit hyperactivity disorder (ADHD), inattentive type, moderate Assessment & Plan: Starting wellbutrin given her lack of attention,  lack of enthusiasm,  normal labs.    Other orders -     Amoxicillin -Pot Clavulanate; Take 1 tablet by mouth 2 (two) times daily.  Dispense: 14  tablet; Refill: 0 -     buPROPion HCl ER (XL); Take 1 tablet (150 mg total) by mouth daily.  Dispense: 90 tablet; Refill: 1     Follow-up: Return in about 6 months (around 07/29/2024).   Veronica Flax, MD

## 2024-01-29 LAB — IRON,TIBC AND FERRITIN PANEL
%SAT: 28 % (ref 16–45)
Ferritin: 59 ng/mL (ref 16–154)
Iron: 101 ug/dL (ref 40–190)
TIBC: 363 ug/dL (ref 250–450)

## 2024-01-29 LAB — TSH: TSH: 2.36 u[IU]/mL (ref 0.450–4.500)

## 2024-01-29 LAB — VITAMIN D 25 HYDROXY (VIT D DEFICIENCY, FRACTURES): Vit D, 25-Hydroxy: 31.3 ng/mL (ref 30.0–100.0)

## 2024-01-29 LAB — VITAMIN B12: Vitamin B-12: 849 pg/mL (ref 232–1245)

## 2024-01-30 ENCOUNTER — Ambulatory Visit: Payer: Self-pay | Admitting: Internal Medicine

## 2024-01-30 MED ORDER — BUPROPION HCL ER (XL) 150 MG PO TB24
150.0000 mg | ORAL_TABLET | Freq: Every day | ORAL | 1 refills | Status: DC
Start: 2024-01-30 — End: 2024-03-29

## 2024-01-30 NOTE — Assessment & Plan Note (Signed)
 Starting wellbutrin given her lack of attention,  lack of enthusiasm,  normal labs.

## 2024-01-30 NOTE — Assessment & Plan Note (Signed)
 Positive depression screen.  Screening labs normal.  Wellbutrin xl trial prescribed

## 2024-02-03 NOTE — Telephone Encounter (Signed)
 Error

## 2024-02-05 ENCOUNTER — Encounter: Payer: Self-pay | Admitting: Internal Medicine

## 2024-02-05 ENCOUNTER — Other Ambulatory Visit: Payer: Self-pay | Admitting: Internal Medicine

## 2024-02-05 MED ORDER — LEVOFLOXACIN 500 MG PO TABS: 500.0000 mg | ORAL_TABLET | Freq: Every day | ORAL | 0 refills | Status: AC

## 2024-03-02 ENCOUNTER — Ambulatory Visit: Admitting: Internal Medicine

## 2024-03-25 ENCOUNTER — Encounter: Payer: Self-pay | Admitting: Internal Medicine

## 2024-03-29 MED ORDER — BUPROPION HCL ER (XL) 300 MG PO TB24: 300.0000 mg | ORAL_TABLET | Freq: Every day | ORAL | 0 refills | Status: AC

## 2024-05-11 ENCOUNTER — Ambulatory Visit: Payer: Self-pay | Admitting: *Deleted

## 2024-05-11 NOTE — Telephone Encounter (Signed)
 LMTCB. Pt will need to schedule an appt to be evaluated. Please schedule pt when pt returns call.

## 2024-05-11 NOTE — Telephone Encounter (Signed)
 Pt called and stated that she is going to do a telehealth visit through her insurance company.

## 2024-05-11 NOTE — Telephone Encounter (Signed)
 Message from Sattley C sent at 05/11/2024  3:30 PM EDT  Summary: Allergies to possible sinus infection   Patient is dealing with allergies that she thinks may have turned into a sinus infection and is requesting antibiotics. She's not dealing with any pain, but post- nasal drip, and she said her lymph node is swollen on one side but there's no pain. [phone] (M)          Call History  Contact Date/Time Type Contact Phone/Fax By  05/11/2024 03:24 PM EDT Phone (Incoming) Veronica Hurst, Veronica Hurst (Self) [phone] Henry Song A   Reason for Disposition  [1] Sinus congestion (pressure, fullness) AND [2] present > 10 days  Answer Assessment - Initial Assessment Questions 1. LOCATION: Where does it hurt?      I have a right lymph node swollen under my jaw.   I've been stuffy with sinus infection starting.    The ragweed is bothering me this time of year.  My right ear is bothering me too     2. ONSET: When did the sinus pain start?  (e.g., hours, days)      2 weeks ago   Having post nasal drip 3. SEVERITY: How bad is the pain?   (Scale 0-10; or none, mild, moderate or severe)     No pain 4. RECURRENT SYMPTOM: Have you ever had sinus problems before? If Yes, ask: When was the last time? and What happened that time?      Yes every year 5. NASAL CONGESTION: Is the nose blocked? If Yes, ask: Can you open it or must you breathe through your mouth?     Yes sinus congestion  6. NASAL DISCHARGE: Do you have discharge from your nose? If so ask, What color?     Sinus congestion 7. FEVER: Do you have a fever? If Yes, ask: What is it, how was it measured, and when did it start?      No 8. OTHER SYMPTOMS: Do you have any other symptoms? (e.g., sore throat, cough, earache, difficulty breathing)     Lymph node is swollen 9. PREGNANCY: Is there any chance you are pregnant? When was your last menstrual period?     Not asked  Protocols used: Sinus Pain or Congestion-A-AH FYI  Only or Action Required?: FYI only for provider.  Patient was last seen in primary care on 01/28/2024 by Marylynn Verneita CROME, MD.  Called Nurse Triage reporting Sinusitis.sinus congestion and right lymph node swollen  Symptoms began several weeks ago.Last 2 weeks  Interventions attempted: OTC medications: without relief.  Symptoms are: gradually worsening.  Triage Disposition: See PCP When Office is Open (Within 3 Days)She is going to do a telehealth visit through her insurance company this evening.  Patient/caregiver understands and will follow disposition?: Yes

## 2024-05-11 NOTE — Telephone Encounter (Signed)
 LMTCB

## 2024-07-11 ENCOUNTER — Encounter: Payer: Self-pay | Admitting: Internal Medicine

## 2024-07-11 DIAGNOSIS — R5383 Other fatigue: Secondary | ICD-10-CM

## 2024-07-11 NOTE — Telephone Encounter (Signed)
 If okay to order I will place the lab orders and schedule pt for a lab appt.

## 2024-07-22 ENCOUNTER — Other Ambulatory Visit

## 2024-07-22 DIAGNOSIS — R5383 Other fatigue: Secondary | ICD-10-CM | POA: Diagnosis not present

## 2024-07-22 LAB — CBC WITH DIFFERENTIAL/PLATELET
Basophils Absolute: 0 K/uL (ref 0.0–0.1)
Basophils Relative: 0.3 % (ref 0.0–3.0)
Eosinophils Absolute: 0 K/uL (ref 0.0–0.7)
Eosinophils Relative: 0.5 % (ref 0.0–5.0)
HCT: 39.6 % (ref 36.0–46.0)
Hemoglobin: 13.4 g/dL (ref 12.0–15.0)
Lymphocytes Relative: 25.6 % (ref 12.0–46.0)
Lymphs Abs: 1.7 K/uL (ref 0.7–4.0)
MCHC: 34 g/dL (ref 30.0–36.0)
MCV: 84.7 fl (ref 78.0–100.0)
Monocytes Absolute: 0.6 K/uL (ref 0.1–1.0)
Monocytes Relative: 8.8 % (ref 3.0–12.0)
Neutro Abs: 4.3 K/uL (ref 1.4–7.7)
Neutrophils Relative %: 64.8 % (ref 43.0–77.0)
Platelets: 190 K/uL (ref 150.0–400.0)
RBC: 4.67 Mil/uL (ref 3.87–5.11)
RDW: 13.6 % (ref 11.5–15.5)
WBC: 6.6 K/uL (ref 4.0–10.5)

## 2024-07-22 LAB — COMPREHENSIVE METABOLIC PANEL WITH GFR
ALT: 15 U/L (ref 0–35)
AST: 16 U/L (ref 0–37)
Albumin: 4.4 g/dL (ref 3.5–5.2)
Alkaline Phosphatase: 50 U/L (ref 39–117)
BUN: 10 mg/dL (ref 6–23)
CO2: 26 meq/L (ref 19–32)
Calcium: 9.1 mg/dL (ref 8.4–10.5)
Chloride: 101 meq/L (ref 96–112)
Creatinine, Ser: 0.72 mg/dL (ref 0.40–1.20)
GFR: 110.84 mL/min (ref >60.00)
Glucose, Bld: 86 mg/dL (ref 70–99)
Potassium: 4.2 meq/L (ref 3.5–5.1)
Sodium: 136 meq/L (ref 135–145)
Total Bilirubin: 0.4 mg/dL (ref 0.2–1.2)
Total Protein: 7 g/dL (ref 6.0–8.3)

## 2024-07-22 LAB — B12 AND FOLATE PANEL
Folate: 10.8 ng/mL (ref >5.9)
Vitamin B-12: 551 pg/mL (ref 211–911)

## 2024-07-22 LAB — TSH: TSH: 2.02 u[IU]/mL (ref 0.35–5.50)

## 2024-07-23 ENCOUNTER — Ambulatory Visit: Payer: Self-pay | Admitting: Internal Medicine

## 2024-07-23 DIAGNOSIS — E611 Iron deficiency: Secondary | ICD-10-CM

## 2024-07-26 NOTE — Addendum Note (Signed)
 Addended by: MARYLYNN VERNEITA CROME on: 07/26/2024 06:09 PM   Modules accepted: Orders

## 2024-07-27 ENCOUNTER — Ambulatory Visit (INDEPENDENT_AMBULATORY_CARE_PROVIDER_SITE_OTHER)

## 2024-07-27 DIAGNOSIS — E611 Iron deficiency: Secondary | ICD-10-CM

## 2024-07-27 LAB — IRON: Iron: 74 ug/dL (ref 42–145)

## 2024-07-28 ENCOUNTER — Ambulatory Visit: Payer: Self-pay | Admitting: Internal Medicine

## 2024-09-26 ENCOUNTER — Ambulatory Visit: Admitting: Internal Medicine
